# Patient Record
Sex: Female | Born: 1965
Health system: Southern US, Community
[De-identification: ages and names within clinical notes are randomized; demographics above are authoritative.]

## PROBLEM LIST (undated history)

## (undated) DIAGNOSIS — K219 Gastro-esophageal reflux disease without esophagitis: Secondary | ICD-10-CM

## (undated) DIAGNOSIS — R519 Headache, unspecified: Secondary | ICD-10-CM

## (undated) DIAGNOSIS — K5190 Ulcerative colitis, unspecified, without complications: Secondary | ICD-10-CM

## (undated) DIAGNOSIS — N870 Mild cervical dysplasia: Secondary | ICD-10-CM

## (undated) DIAGNOSIS — M1990 Unspecified osteoarthritis, unspecified site: Secondary | ICD-10-CM

## (undated) DIAGNOSIS — R21 Rash and other nonspecific skin eruption: Secondary | ICD-10-CM

## (undated) DIAGNOSIS — R5382 Chronic fatigue, unspecified: Secondary | ICD-10-CM

## (undated) DIAGNOSIS — M797 Fibromyalgia: Secondary | ICD-10-CM

## (undated) DIAGNOSIS — K529 Noninfective gastroenteritis and colitis, unspecified: Secondary | ICD-10-CM

## (undated) HISTORY — DX: Rash and other nonspecific skin eruption: R21

## (undated) HISTORY — DX: Noninfective gastroenteritis and colitis, unspecified: K52.9

## (undated) HISTORY — DX: Mild cervical dysplasia: N87.0

## (undated) HISTORY — DX: Fibromyalgia: M79.7

## (undated) HISTORY — PX: CHOLECYSTECTOMY: SHX55

## (undated) HISTORY — DX: Chronic fatigue, unspecified: R53.82

## (undated) HISTORY — PX: CERVICAL DISC SURGERY: SHX588

## (undated) HISTORY — PX: INTRAUTERINE DEVICE INSERTION: SHX323

---

## 1989-03-27 HISTORY — PX: GYNECOLOGIC CRYOSURGERY: SHX857

## 1998-08-16 ENCOUNTER — Observation Stay (HOSPITAL_COMMUNITY): Admission: AD | Admit: 1998-08-16 | Discharge: 1998-08-16 | Payer: Self-pay | Admitting: Gynecology

## 1998-08-27 ENCOUNTER — Other Ambulatory Visit: Admission: RE | Admit: 1998-08-27 | Discharge: 1998-08-27 | Payer: Self-pay | Admitting: Gynecology

## 1998-09-24 ENCOUNTER — Inpatient Hospital Stay (HOSPITAL_COMMUNITY): Admission: AD | Admit: 1998-09-24 | Discharge: 1998-09-24 | Payer: Self-pay | Admitting: Gynecology

## 1999-03-03 ENCOUNTER — Inpatient Hospital Stay (HOSPITAL_COMMUNITY): Admission: AD | Admit: 1999-03-03 | Discharge: 1999-03-05 | Payer: Self-pay | Admitting: Gynecology

## 1999-03-04 ENCOUNTER — Encounter: Payer: Self-pay | Admitting: Obstetrics and Gynecology

## 1999-04-10 ENCOUNTER — Inpatient Hospital Stay (HOSPITAL_COMMUNITY): Admission: AD | Admit: 1999-04-10 | Discharge: 1999-04-13 | Payer: Self-pay | Admitting: Gynecology

## 1999-05-23 ENCOUNTER — Other Ambulatory Visit: Admission: RE | Admit: 1999-05-23 | Discharge: 1999-05-23 | Payer: Self-pay | Admitting: Gynecology

## 1999-11-03 ENCOUNTER — Inpatient Hospital Stay (HOSPITAL_COMMUNITY): Admission: EM | Admit: 1999-11-03 | Discharge: 1999-11-06 | Payer: Self-pay | Admitting: *Deleted

## 1999-11-03 ENCOUNTER — Encounter (INDEPENDENT_AMBULATORY_CARE_PROVIDER_SITE_OTHER): Payer: Self-pay | Admitting: Specialist

## 1999-11-04 ENCOUNTER — Encounter: Payer: Self-pay | Admitting: General Surgery

## 1999-11-05 ENCOUNTER — Encounter: Payer: Self-pay | Admitting: General Surgery

## 2000-06-13 ENCOUNTER — Other Ambulatory Visit: Admission: RE | Admit: 2000-06-13 | Discharge: 2000-06-13 | Payer: Self-pay | Admitting: Gynecology

## 2001-11-20 ENCOUNTER — Ambulatory Visit (HOSPITAL_COMMUNITY): Admission: RE | Admit: 2001-11-20 | Discharge: 2001-11-20 | Payer: Self-pay | Admitting: Family Medicine

## 2001-11-20 ENCOUNTER — Encounter: Payer: Self-pay | Admitting: Family Medicine

## 2002-07-02 ENCOUNTER — Other Ambulatory Visit: Admission: RE | Admit: 2002-07-02 | Discharge: 2002-07-02 | Payer: Self-pay | Admitting: Gynecology

## 2002-10-16 ENCOUNTER — Encounter: Payer: Self-pay | Admitting: Gynecology

## 2002-10-16 ENCOUNTER — Encounter: Admission: RE | Admit: 2002-10-16 | Discharge: 2002-10-16 | Payer: Self-pay | Admitting: Gynecology

## 2002-11-13 ENCOUNTER — Encounter (INDEPENDENT_AMBULATORY_CARE_PROVIDER_SITE_OTHER): Payer: Self-pay | Admitting: *Deleted

## 2002-11-13 ENCOUNTER — Ambulatory Visit (HOSPITAL_COMMUNITY): Admission: RE | Admit: 2002-11-13 | Discharge: 2002-11-13 | Payer: Self-pay | Admitting: Gastroenterology

## 2003-03-10 ENCOUNTER — Ambulatory Visit (HOSPITAL_COMMUNITY): Admission: RE | Admit: 2003-03-10 | Discharge: 2003-03-10 | Payer: Self-pay | Admitting: Otolaryngology

## 2003-03-28 DIAGNOSIS — N870 Mild cervical dysplasia: Secondary | ICD-10-CM

## 2003-03-28 HISTORY — DX: Mild cervical dysplasia: N87.0

## 2003-08-03 ENCOUNTER — Other Ambulatory Visit: Admission: RE | Admit: 2003-08-03 | Discharge: 2003-08-03 | Payer: Self-pay | Admitting: Gynecology

## 2004-01-22 ENCOUNTER — Encounter: Admission: RE | Admit: 2004-01-22 | Discharge: 2004-01-22 | Payer: Self-pay | Admitting: Gynecology

## 2004-04-04 ENCOUNTER — Other Ambulatory Visit: Admission: RE | Admit: 2004-04-04 | Discharge: 2004-04-04 | Payer: Self-pay | Admitting: Gynecology

## 2004-08-04 ENCOUNTER — Other Ambulatory Visit: Admission: RE | Admit: 2004-08-04 | Discharge: 2004-08-04 | Payer: Self-pay | Admitting: Gynecology

## 2005-03-06 ENCOUNTER — Other Ambulatory Visit: Admission: RE | Admit: 2005-03-06 | Discharge: 2005-03-06 | Payer: Self-pay | Admitting: Gynecology

## 2005-03-21 ENCOUNTER — Encounter: Admission: RE | Admit: 2005-03-21 | Discharge: 2005-03-21 | Payer: Self-pay | Admitting: Gynecology

## 2005-08-11 ENCOUNTER — Other Ambulatory Visit: Admission: RE | Admit: 2005-08-11 | Discharge: 2005-08-11 | Payer: Self-pay | Admitting: Gynecology

## 2005-09-22 ENCOUNTER — Inpatient Hospital Stay (HOSPITAL_COMMUNITY): Admission: AD | Admit: 2005-09-22 | Discharge: 2005-10-11 | Payer: Self-pay | Admitting: Gastroenterology

## 2005-09-26 ENCOUNTER — Encounter (INDEPENDENT_AMBULATORY_CARE_PROVIDER_SITE_OTHER): Payer: Self-pay | Admitting: *Deleted

## 2005-10-06 ENCOUNTER — Ambulatory Visit: Payer: Self-pay | Admitting: Gastroenterology

## 2005-10-18 ENCOUNTER — Encounter (HOSPITAL_COMMUNITY): Admission: RE | Admit: 2005-10-18 | Discharge: 2005-12-13 | Payer: Self-pay | Admitting: Gastroenterology

## 2006-01-12 ENCOUNTER — Encounter (HOSPITAL_COMMUNITY): Admission: RE | Admit: 2006-01-12 | Discharge: 2006-04-12 | Payer: Self-pay | Admitting: Gastroenterology

## 2006-05-23 ENCOUNTER — Encounter: Admission: RE | Admit: 2006-05-23 | Discharge: 2006-05-23 | Payer: Self-pay | Admitting: Gynecology

## 2006-06-08 ENCOUNTER — Encounter (HOSPITAL_COMMUNITY): Admission: RE | Admit: 2006-06-08 | Discharge: 2006-09-06 | Payer: Self-pay | Admitting: Gastroenterology

## 2006-09-07 ENCOUNTER — Other Ambulatory Visit: Admission: RE | Admit: 2006-09-07 | Discharge: 2006-09-07 | Payer: Self-pay | Admitting: Gynecology

## 2006-11-09 ENCOUNTER — Encounter (HOSPITAL_COMMUNITY): Admission: RE | Admit: 2006-11-09 | Discharge: 2007-01-30 | Payer: Self-pay | Admitting: Gastroenterology

## 2007-02-13 ENCOUNTER — Other Ambulatory Visit: Admission: RE | Admit: 2007-02-13 | Discharge: 2007-02-13 | Payer: Self-pay | Admitting: Gynecology

## 2007-02-22 ENCOUNTER — Encounter (HOSPITAL_COMMUNITY): Admission: RE | Admit: 2007-02-22 | Discharge: 2007-04-05 | Payer: Self-pay | Admitting: Gastroenterology

## 2007-05-10 ENCOUNTER — Encounter (HOSPITAL_COMMUNITY): Admission: RE | Admit: 2007-05-10 | Discharge: 2007-08-08 | Payer: Self-pay | Admitting: Gastroenterology

## 2007-07-04 ENCOUNTER — Encounter: Admission: RE | Admit: 2007-07-04 | Discharge: 2007-07-04 | Payer: Self-pay | Admitting: Gynecology

## 2007-09-16 ENCOUNTER — Other Ambulatory Visit: Admission: RE | Admit: 2007-09-16 | Discharge: 2007-09-16 | Payer: Self-pay | Admitting: Gynecology

## 2007-10-18 ENCOUNTER — Encounter (HOSPITAL_COMMUNITY): Admission: RE | Admit: 2007-10-18 | Discharge: 2007-12-18 | Payer: Self-pay | Admitting: Gastroenterology

## 2007-12-20 ENCOUNTER — Encounter (HOSPITAL_COMMUNITY): Admission: RE | Admit: 2007-12-20 | Discharge: 2008-03-19 | Payer: Self-pay | Admitting: Gastroenterology

## 2008-04-24 ENCOUNTER — Encounter (HOSPITAL_COMMUNITY): Admission: RE | Admit: 2008-04-24 | Discharge: 2008-07-23 | Payer: Self-pay | Admitting: Gastroenterology

## 2008-07-20 ENCOUNTER — Encounter: Admission: RE | Admit: 2008-07-20 | Discharge: 2008-07-20 | Payer: Self-pay | Admitting: Gynecology

## 2008-08-14 ENCOUNTER — Encounter (HOSPITAL_COMMUNITY): Admission: RE | Admit: 2008-08-14 | Discharge: 2008-11-12 | Payer: Self-pay | Admitting: Gastroenterology

## 2008-12-18 ENCOUNTER — Encounter (HOSPITAL_COMMUNITY): Admission: RE | Admit: 2008-12-18 | Discharge: 2009-03-18 | Payer: Self-pay | Admitting: Gastroenterology

## 2009-01-26 ENCOUNTER — Encounter: Admission: RE | Admit: 2009-01-26 | Discharge: 2009-03-24 | Payer: Self-pay | Admitting: Gastroenterology

## 2009-02-04 ENCOUNTER — Encounter: Payer: Self-pay | Admitting: Gynecology

## 2009-02-04 ENCOUNTER — Other Ambulatory Visit: Admission: RE | Admit: 2009-02-04 | Discharge: 2009-02-04 | Payer: Self-pay | Admitting: Gynecology

## 2009-02-04 ENCOUNTER — Ambulatory Visit: Payer: Self-pay | Admitting: Gynecology

## 2009-04-09 ENCOUNTER — Encounter (HOSPITAL_COMMUNITY): Admission: RE | Admit: 2009-04-09 | Discharge: 2009-07-08 | Payer: Self-pay | Admitting: Gastroenterology

## 2009-07-19 ENCOUNTER — Ambulatory Visit: Payer: Self-pay | Admitting: Gynecology

## 2009-08-20 ENCOUNTER — Encounter (HOSPITAL_COMMUNITY): Admission: RE | Admit: 2009-08-20 | Discharge: 2009-11-18 | Payer: Self-pay | Admitting: Gastroenterology

## 2009-12-17 ENCOUNTER — Encounter (HOSPITAL_COMMUNITY)
Admission: RE | Admit: 2009-12-17 | Discharge: 2010-03-17 | Payer: Self-pay | Source: Home / Self Care | Attending: Gastroenterology | Admitting: Gastroenterology

## 2010-01-14 ENCOUNTER — Encounter: Admission: RE | Admit: 2010-01-14 | Discharge: 2010-01-14 | Payer: Self-pay | Admitting: Gynecology

## 2010-01-21 ENCOUNTER — Encounter: Admission: RE | Admit: 2010-01-21 | Discharge: 2010-01-21 | Payer: Self-pay | Admitting: Gynecology

## 2010-02-07 ENCOUNTER — Ambulatory Visit: Payer: Self-pay | Admitting: Gynecology

## 2010-02-07 ENCOUNTER — Other Ambulatory Visit
Admission: RE | Admit: 2010-02-07 | Discharge: 2010-02-07 | Payer: Self-pay | Source: Home / Self Care | Admitting: Gynecology

## 2010-02-23 ENCOUNTER — Ambulatory Visit: Payer: Self-pay | Admitting: Gynecology

## 2010-04-08 ENCOUNTER — Encounter (HOSPITAL_COMMUNITY)
Admission: RE | Admit: 2010-04-08 | Discharge: 2010-04-26 | Payer: Self-pay | Source: Home / Self Care | Attending: Gastroenterology | Admitting: Gastroenterology

## 2010-06-03 ENCOUNTER — Encounter (HOSPITAL_COMMUNITY): Payer: BC Managed Care – PPO | Attending: Gastroenterology

## 2010-06-03 DIAGNOSIS — K5100 Ulcerative (chronic) pancolitis without complications: Secondary | ICD-10-CM | POA: Insufficient documentation

## 2010-07-29 ENCOUNTER — Encounter (HOSPITAL_COMMUNITY): Payer: BC Managed Care – PPO | Attending: Gastroenterology

## 2010-07-29 DIAGNOSIS — K5100 Ulcerative (chronic) pancolitis without complications: Secondary | ICD-10-CM | POA: Insufficient documentation

## 2010-08-12 NOTE — Op Note (Signed)
   NAME:  Sarah Ramos, Sarah Ramos                           ACCOUNT NO.:  [id number]   MEDICAL RECORD NO.:  [id number]                   PATIENT TYPE:  AMB   LOCATION:  ENDO                                 FACILITY:  [facility]   PHYSICIAN:  Graylin Shiver, M.D.                DATE OF BIRTH:  09-Jun-1965   DATE OF PROCEDURE:  11/13/2002  DATE OF DISCHARGE:                                 OPERATIVE REPORT   PROCEDURE:  Colonoscopy and biopsy.   INDICATIONS FOR PROCEDURE:  Rectal bleeding.   CONSENT:  Informed consent was obtained after explanation of the risks of  bleeding, infection, and perforation.   PREMEDICATION:  Fentanyl 70 mcg IV, Versed 7 mg IV.   DESCRIPTION OF PROCEDURE:  With the patient in the left lateral decubitus  position, the rectal exam was performed.  No masses were felt.  The Olympus  colonoscope was inserted into the rectum and advanced around the colon to  the cecum.  Cecal landmarks were identified.  The cecum and ascending colon  were normal.  The transverse colon was normal.  The descending colon and  sigmoid were normal.  In the rectum there was a small 3 mm sessile polyp  removed with cold forceps.  She tolerated the procedure well without  complications.   IMPRESSION:  Small rectal polyp.   PLAN:  Pathology will be checked.                                               Graylin Shiver, M.D.    SFG/MEDQ  D:  11/13/2002  T:  11/13/2002  Job:  [id number]   cc:   Marcial Pacas P. Audie Box, M.D.  [address]  [place]  [place] [address]  Fax: [phone]

## 2010-08-12 NOTE — Discharge Summary (Signed)
[facility]  Patient:    Sarah Ramos, Sarah Ramos                        MRN: [id number] Adm. Date:  [id number] Disc. Date: [id number] Attending:  Arlis Porta CC:         Verlin Grills, M.D.             Meredith Staggers, M.D.                           Discharge Summary  PRINCIPAL DISCHARGE DIAGNOSIS:  Choledocholithiasis.  SECONDARY DIAGNOSES:  Calculus cholecystitis.  PROCEDURES: 1. ERCP by Dr. Danise Edge, November 04, 1999. 2. Laparoscopic cholecystectomy with intraoperative cholangiogram and    transcystic common bile duct exploration, November 04, 1999. 3. Repeat ERCP done by Dr. Danise Edge, November 05, 1999.  REASON FOR ADMISSION:  [name] is a 45 year old female who is seven months postpartum with the acute onset of right upper quadrant pain the day prior to admission, associated with nausea and vomiting.  She subsequently underwent an ultrasound of her abdomen.  This demonstrated cholelithiasis.  She was noted to be jaundiced by her primary caregiver and was subsequently seen by me in the emergency department.  Her abdomen was mildly tender.  She had a total bilirubin of 5.9.  She subsequently was admitted.  HOSPITAL COURSE:  Gastroenterology consultation was obtained by Dr. Danise Edge, and she went to the endoscopy suite for ERCP on November 04, 1999.  An endoscopic sphincterotomy was performed, and a common bile duct stone was removed with the biliary basket.  She tolerated this well and, later that evening, was taken to the operating room where she underwent a laparoscopic cholecystectomy with cholangiogram.  This demonstrated two obstructing common bile duct stones.  One was able to be retrieved, but the other was not.  The following day she underwent repeat ERCP, and this evacuated the remaining stone.  Following that, she had a stable postoperative course and, on November 06, 1999, she was able to be  discharged.  DISPOSITION:  Discharged to home on November 06, 1999.  DISCHARGE INSTRUCTIONS:  She was given Mepergan Fortis for pain.  She was given an instruction sheet, and she will come back to see me in two weeks for follow-up.  CONDITION ON DISCHARGE:  Satisfactory. DD:  11/18/99 TD:  11/21/99 Job: [id number]

## 2010-08-12 NOTE — Discharge Summary (Signed)
[facility]  Patient:    Sarah Ramos                         MRN: [id number] Adm. Date:  [id number] Disc. Date: [id number] Attending:  Merrily Pew Dictator:   Gwendolyn Fill. [place], P.A. CC:         840                           Discharge Summary  DISCHARGE DIAGNOSIS:          Intrauterine pregnancy, 39+ weeks, delivered, status post forceps assisted vaginal delivery.  HISTORY:                      This is a 45 year old female gravida 1, para 0 with an EDC of April 07, 1999.  Prenatal course was complicated by hyperemesis.  HOSPITAL COURSE:              On April 10, 1999, the patient presented at 39+  weeks with spontaneous rupture of membranes.  Subsequently, on April 11, 1999, at 0354, the patient underwent a Simpson forceps assisted vaginal delivery of a female, Apgars of 7 and 9, weight of 6 pounds 8 ounces.  There was a tight nuchal cord 1. There was no episiotomy.  Sacral and midline lacerations repaired, and a torn left labia minora which was also repaired.  Postpartum, the patient remained afebrile, voided, and in stable condition.  She was discharged to home on April 13, 1999, and given [employer] postpartum booklet.  LABORATORY:                   The patient is A positive, Rubella immune.  On April 11, 1999, hemoglobin was 10.3, hematocrit 29.7.  DISPOSITION:                  The patient was discharged home.  She will return to the office in six weeks.  If any problems prior to that time, she will be seen n the office. DD:  04/29/99 TD:  04/30/99 Job: [id number]

## 2010-08-12 NOTE — Procedures (Signed)
[facility]  Patient:    Sarah Ramos, Sarah Ramos                          MRN: [id number] Proc. Date: 11/05/99 Attending:  Verlin Grills, M.D. CC:         Adolph Pollack, M.D.   Procedure Report  PROCEDURE:  Endoscopic retrograde cholangiopancreatography with common bile duct extraction.  REFERRING PHYSICIAN:  Avel Peace, M.D.  INDICATIONS FOR PROCEDURE:  Ms. Sarah Ramos is a 45 year old  female admitted to the hospital November 03, 1999 with biliary colic pain, gallstones, and common bile duct stones.  On November 04, 1999, she underwent an endoscopic retrograde cholangiopancreatography which revealed an isolated common bile duct stone. An endoscopic sphincterotomy was performed. I thought I had cleared the bile duct of stones using the biliary basket.  On November 04, 1999, Ms. Sarah Ramos underwent a laparoscopic cholecystectomy with intraoperative cholangiogram. The operative cholangiogram reveals filling defects in the common bile duct compatible with stones.  Ms. Sarah Ramos has signed the operative permit for a repeat endoscopic retrograde cholangiography to extract common bile duct stones.  ENDOSCOPIST:  Reece Agar, M.D.  PREMEDICATION:  Versed 10 mg, fentanyl 100 mcg.  ENDOSCOPE:  Olympus duodenoscope.  PROCEDURE:  After obtaining informed consent, the patient was placed in the left lateral decubitus position on the fluoroscopy table. I administered intravenous Demerol and intravenous Versed to achieve conscious sedation for the procedure. The patients blood pressure, oxygen saturation and cardiac rhythm were monitored throughout the procedure and documented in the medical record.  The Olympus duodenoscope was passed through the posterior hypopharynx and down the esophagus into the proximal stomach without difficulty. The Olympus duodenoscope was passed through the posterior hypopharynx down the esophagus into the proximal stomach without  difficulty. A normal appearing pylorus was intubated and the endoscope advanced to the second portion of the duodenum without difficulty.  MAJOR PAPILLA: The endoscopic sphincterotomy site is quite patent and is draining clear bile.  PANCREATOGRAM:  The pancreatic cut was not cannulated and a pancreatogram was not performed.  CHOLANGIOGRAM:  Using the 8.5 mm balloon catheter, I performed multiple sweeps of the extrahepatic biliary tract resulting in the delivery of 1 common bile duct stone into the duodenum through the patent sphincterotomy. The inflated 8.5 mm balloon easily traversed through the sphincterotomy site.  A final occlusion cholangiogram using the 8.5 mm balloon catheter with the radiologist present revealed no biliary filling defects. DD:  11/05/99 TD:  11/07/99 Job: [id number]

## 2010-08-12 NOTE — H&P (Signed)
[facility]  Patient:    Sarah Ramos, Sarah Ramos                        MRN: [id number] Adm. Date:  [id number] Attending:  Arlis Porta CC:         Meredith Staggers, M.D., [facility]  Charolett Bumpers III, M.D.   History and Physical  REASON FOR ADMISSION:  Abdominal pain in the right upper quadrant and gallstones.  HISTORY OF PRESENT ILLNESS:  This is a 45 year old female who is seven months postpartum.  She had the acute onset of right upper quadrant pain yesterday associated with nausea and vomiting.  She presented to [facility], was seen and had an ultrasound on her today which, by report, demonstrated multiple gallstones.  The common bile duct was reported to be normal in diameter with no biliary duct dilatation.  No pericholecystic fluid was seen. When she went back to [facility] for followup, she was noted to have scleral icterus.  She subsequently was sent to the emergency department to be evaluated by me.  She did state that during her pregnancy, she had severe nausea and vomiting.  PAST MEDICAL HISTORY:  No chronic illnesses.  PREVIOUS OPERATIONS:  None.  ALLERGIES:  CODEINE and TETRACYCLINE.  MEDICATIONS:  Darvocet-N and birth control pills.  SOCIAL HISTORY:  She is married.  One child.  Denies tobacco or alcohol use.  FAMILY HISTORY:  Father had Guillain-Barre and hypertension.  REVIEW OF SYSTEMS:  CARDIOVASCULAR:  No known heart disease or hypertension. PULMONARY:  No asthma, COPD, TB or pneumonia.  GI:  No peptic ulcer disease or hepatitis.  GU:  No kidney stones or urinary tract infection.  ENDOCRINE:  No diabetes or thyroid disease.  HEMATOLOGIC:  No bleeding disorders or transfusions.  PHYSICAL EXAMINATION:  GENERAL:  A comfortable-appearing female.  VITAL SIGNS:  Temperature is 97.7.  Blood pressure 137/70.  Pulse 70. Respirations are 20.  Weight is 180 pounds.  Height 5 feet 8  inches.  SKIN:  Warm with jaundice.  EYES:  Extraocular motions intact and sclerae are icteric.  NECK:  Supple without masses or thyromegaly.  CARDIOVASCULAR:  Heart demonstrates a regular rate and rhythm without murmur, gallop or rub.  No lower extremity edema.  RESPIRATORY:  Breath sounds are equal and clear.  Respirations are nonlabored.  ABDOMEN:  Soft with mild right upper quadrant tenderness to deep palpation. No palpable masses.  No guarding.  Normal bowel sounds are heard.  MUSCULOSKELETAL:  Full range of motion of extremities.  LABORATORY DATA:  Laboratory data is remarkable for normal electrolytes.  Her total bilirubin is 5.8 with alkaline phosphatase of 218, ALT of 298 and AST of 181.  The WBC is normal at 6600 with a normal hemoglobin at 13.2.  Amylase and lipase normal.  IMPRESSION:  Biliary colic secondary to cholelithiasis and choledocholithiasis.  Does not appear to be toxic at this time.  PLAN:  Admission to the hospital.  Will begin empiric IV antibiotics.  I have consulted Dr. Charolett Bumpers III regarding ERCP and likely will need to follow this with a cholecystectomy.  The procedure, a laparoscopic cholecystectomy, including, but not limited to, bleeding, infection, common bile duct injury, bile leak from liver bed and the risks of general anesthesia were explained to her; it was also explained to her that if Dr. Laural Benes was unable to evacuate the common duct stone, she may need  an extensive open procedure.  Both she and her husband seem to understand and agree with the plan. DD:  11/03/99 TD:  11/04/99 Job: [id number]

## 2010-08-12 NOTE — H&P (Signed)
[name], Sarah Ramos                 ACCOUNT NO.:  [id number]   MEDICAL RECORD NO.:  [id number]          PATIENT TYPE:  INP   LOCATION:  5742                         FACILITY:  [facility]   PHYSICIAN:  Jordan Hawks. Elnoria Howard, MD    DATE OF BIRTH:  08-21-1965   DATE OF ADMISSION:  09/22/2005  DATE OF DISCHARGE:                                HISTORY & PHYSICAL   PRIMARY CARE PHYSICIAN:  Robert A. Nicholos Johns, M.D.   REASON FOR ADMISSION:  Ulcerative colitis flare.   HISTORY OF PRESENT ILLNESS:  This is a 45 year old female with a past  medical history of ulcerative colitis seen by Dr. Loreta Ave in the past who was  admitted for __________ of her ulcerative colitis flare.  Patient initially  saw Dr. Loreta Ave on September 12, 2005 with a complaint of having 9-10 bloody bowel  movements per day as well as nausea and abdominal pain.  She was started on  Colazal 750 mg 2 tablets p.o. t.i.d. as well as an increase in her Asacol to  4 tablets p.o. t.i.d. per Dr. Loreta Ave for the treatment of her flare.  Unfortunately, during the interval period, she has not progressed, and she  states that she has lost approximately 10 pounds, and the pain persists.  Her bloody bowel movements appear to have increased to approximately 15  bowel movements per day and she stays feeling weak.  She denies having any  fever, joint aches, or skin rashes at this time.  The patient was previously  treated with steroids in the past.  Unfortunately, she developed a reaction  to the medication with complaints of chest pain that feels like a heart  attack.  Therefore, she was not started on the medication and only  maintained on a 5-ASA product.  She called the office yesterday and reported  her complaints.  It was felt that she should be admitted to the hospital  yesterday; however, she wanted to wait until the next day in order to  arrange her family affairs before being admitted to the hospital.  On the  day of admission, the patient's clinical status  had not changed, and she is  subsequently awaiting further treatment in the hospital.   PAST MEDICAL HISTORY:  Significant for ulcerative colitis, depression, and  migraine headaches.   PAST SURGICAL HISTORY:  Status post laparoscopic cholecystectomy in 2001 and  normal vaginal delivery in 2001.   MEDICATIONS AT HOME:  Xanax, Asacol, hyoscyamine, and fluoxetine.   REVIEW OF SYSTEMS:  As per the HPI and also negative for blurry vision,  dizziness, chest pain, shortness of breath, rashes, joint aches,  arthralgias.  Positive for urinary hesitance.  No gross neurologic deficits.   FAMILY HISTORY:  Noncontributory.   SOCIAL HISTORY:  Negative for alcohol, tobacco, or illicit drug use.   PHYSICAL EXAMINATION:  VITAL SIGNS:  Blood pressure 123/90, heart rate 104,  respirations 18, temperature 98.4.  Pulse ox is 98% on room air.  GENERAL:  The patient is in no acute distress; however, she does appear to  be comfortable.  HEENT:  Normocephalic and  atraumatic.  Extraocular muscles are intact.  Pupils are equal, round and reactive to light.  NECK:  Supple.  No lymphadenopathy.  LUNGS:  Clear to auscultation bilaterally.  CARDIOVASCULAR:  Regular rate and rhythm without murmurs, rubs or gallops.  Tachycardic.  ABDOMEN:  Flat, soft, obese.  Tender to mild palpation.  No consistent  evidence of rebound and no rigidity or guarding.  Positive bowel sounds.  EXTREMITIES:  No clubbing, cyanosis or edema.   LABORATORY VALUES:  White blood cell count 9.3, hemoglobin 13.6, platelets  377.  Sodium 139, potassium 3.6, chloride 102, CO2 25, BUN 6, creatinine 1,  glucose 84, AST 19, ALT 16, alk phos 66, total bilirubin 0.6, albumin 3.6.  ESR is 31.   IMPRESSION:  Ulcerative colitis flare:  It is apparent from the patient's  history that she has not improved with the Colazal that was prescribed.  She  is on a maximal dose of the Asacol, per her history, and she should be  maintained on this  medication at this time.  I am concerned about her  abdominal pain, and she does not appear to be toxic at this time, and the  pain has not progressed; however, further imaging to rule out toxic  megacolon is necessary at this time.   PLAN:  1.  Maintain Asacol 400 mg 4 tablets p.o. t.i.d.  2.  Start budesonide 9 mg 1 p.o. daily.  Hopefully, she will be able to      tolerate this medication without any side effects of chest pain, as this      has less systemic side effects.  3.  Start Imuran 50 mg 1 p.o. daily.  This will be used in hopes of long-      term immunosuppression.  4.  CT scan of the abdomen and pelvis.  5.  D5 normal saline at 200 cc/hr.  6.  Pain control with PCA.  7.  Check a PPD status.  If the patient fails budesonide, Remicade will need      to be instituted.      Jordan Hawks Elnoria Howard, MD  Electronically Signed     PDH/MEDQ  D:  09/22/2005  T:  09/22/2005  Job:  [id number]   cc:   Molly Maduro A. Nicholos Johns, M.D.  Fax: [phone]

## 2010-08-12 NOTE — Procedures (Signed)
[facility]  Patient:    Sarah Ramos, Sarah Ramos                          MRN: [id number] Proc. Date: 11/04/99 Attending:  Verlin Grills, M.D. CC:         Adolph Pollack, M.D.   Procedure Report  PROCEDURE:  Endoscopic retrograde cholangiopancreatography with endoscopic sphincterotomy and common bile duct stone removal.  REFERRING PHYSICIAN:  Avel Peace, M.D.  INDICATIONS FOR PROCEDURE:  Sarah Ramos is a 45 year old female who is 7 months postpartum. She presented to the [facility] Emergency Room November 03, 1999 with biliary colic type pain, gallstones by ultrasound, and abnormal liver enzymes (total bilirubin 5.9, alkaline phosphatase 218, SGOT 181, and SGPT 298). Her amylase and lipase were normal on admission.  I discussed with Sarah Ramos the complications associated with endoscopic retrograde cholangiopancreatography with endoscopic sphincterotomy and stone removal including pancreatitis, bleeding, intestinal perforation and cholangitis. Sarah Ramos understands the procedure with its risks and has signed the operative permit. She is currently on intravenous Unasyn.  ENDOSCOPIST:  Reece Agar, M.D.  PREMEDICATION:  Versed 17.5 mg, fentanyl 100 mcg.  ENDOSCOPE:  Olympus diagnostic duodenoscope.  DESCRIPTION OF PROCEDURE:  After obtaining informed consent, the patient was placed supine on the fluoroscopy table. I administered intravenous Demerol and intravenous Versed to achieve conscious sedation for the procedure. The patients blood pressure, oxygen saturation and cardiac rhythm were monitored throughout the procedure and documented in the medical record.  The Olympus diagnostic duodenoscope was passed through the posterior hypopharynx down the esophagus into the proximal stomach without difficulty. A normal appearing pylorus was intubated and the endoscope easily advanced to the second portion of the duodenum.  MAJOR PAPILLA:   Endoscopically, the major papilla appears normal. There are no duodenal diverticula or signs of duodenal tumors.  PANCREATOGRAM:  I did partially inject the pancreatic duct on 2 occasions with contrast. The mid-distal pancreatic duct appeared normal.  CHOLANGIOGRAM:  Using the tapered sphincterotome, I was able to freely cannulate the common bile duct. The intrahepatic, extrahepatic biliary ductal system as well as the gallbladder was filled with contrast. There is a round filling defect in the common bile duct compatible with a stone. There are multiple stones in the gallbladder. The cystic duct appears patent. There were no other abnormalities of the intrahepatic or extrahepatic biliary system.  An endoscopic sphincterotomy was performed without apparent complications. I was unable to retrieve the stone using the 8.5 mm balloon catheter. Using the biliary retrieval basket, I was able to capture the stone and remove it successfully.  Upon completion of the basket stone removal, a repeat occlusion cholangiogram using the 8.5 mm balloon catheter revealed no other filling defects.  ASSESSMENT:  Choledocholithiasis and cholelithiasis. Endoscopic retrograde cholangiopancreatography with endoscopic sphincterotomy performed. Biliary stone removal with the biliary basket.  PLAN:  Because I did inject the pancreatic duct on 2 occasions and there was some trauma in removing the biliary stone, I will need to observe Sarah Ramos carefully for signs of pancreatitis. I will keep her on intravenous Unasyn. If she has no abdominal pain, she can proceed with a laparoscopic cholecystectomy. DD:  11/04/99 TD:  11/05/99 Job: [id number]

## 2010-08-12 NOTE — Discharge Summary (Signed)
[name], Ramos                 ACCOUNT NO.:  [id number]   MEDICAL RECORD NO.:  [id number]          PATIENT TYPE:  INP   LOCATION:  5742                         FACILITY:  [facility]   PHYSICIAN:  Jordan Hawks. Elnoria Howard, MD    DATE OF BIRTH:  03/27/1966   DATE OF ADMISSION:  09/22/2005  DATE OF DISCHARGE:  10/11/2005                                 DISCHARGE SUMMARY   ADMISSION DIAGNOSIS:  Severe ulcerative colitis flare.   DISCHARGE DIAGNOSIS:  Severe ulcerative colitis flare.   HISTORY OF PRESENT ILLNESS:  Please see the original history and physical  for complete details.   HOSPITAL COURSE:  The patient was admitted to the hospital for multiple  bloody bowel movements as well as dehydration and pain.  Upon initial  presentation the patient was started on budesonide as she states she was  having difficulty with prednisone in the past, which resulted in severe  chest pain.  The budesonide was provided over the weekend and unfortunately,  she had no significant response.  She continued to have pain and bloody  bowel movement.  At that time it was felt to retry the prednisone and she  was given a 5 mg test dose which she tolerated without any difficulty.  Subsequently she was started on Solu-Medrol 40 mg IV q.12 h. which she  tolerated without any difficulty over the intervening period she did not  demonstrate any significant improvement on the steroids.  She continued to  have multiple bowel movements that ranged from 7-15 times per day,  associated with blood, and they were liquid in nature.  She was on Dilaudid  PCA and required the medication on a frequent basis. Because of the  nonprogression of her disease, a colonoscopy was performed which revealed a  pancolitis with the exception of the most proximal portion of descending  colon and cecum being spared.  The majority of the colon was noted to have  severe ulcerations and erosions which were friable and granular.  Surprisingly, the  distal portion of the colon was not as severe in regards  to the inflammation.  Biopsies were taken and confirmed the gross appearance  of the ulcerative colitis.   After 1 week of being on Solu-Medrol without any significant improvement,  the decision was made to start the patient on Remicade.  She received an  infusion 1 week prior to discharge at 5 mg/kg which she tolerated quite  well; however, the patient did complain about having a headache that lasted  for approximately 5 days.  After the infusion, she was maintained on an  n.p.o. status as well as continuing to receive TPN over the course of the  hospitalization.  With the infusion of the TPN she was able to maintain her  nutritional status initially.  Initially she was made n.p.o. and not  provided any significant type of nutrition for approximately 1 week in the  hospital; however, she was given D5 normal saline.  Over the course of the  hospitalization after the infusion, the patient did slowly improve to the  point that she  was able to tolerate liquids without much pain or  exacerbation of her diarrhea; and 2 days before discharge, she was able to  start on a solid food regimen which she tolerated without any difficulty.  On the day of discharge she felt much improved and her appetite had  returned; and the pain was markedly less.  She did continue to have some  multiple bowel movements; however on the evening before discharge.  She had  reported having no bowel movement.  Improvement in regards to the diarrhea  was that the stool was becoming more formed; and there is no evidence of any  blood.  Because of her improvement, it was felt that she would be able to be  discharged home safely.   PLAN AT THIS TIME:  1.  The patient is to followup with the repeat infusion of Remicade in 1      week.  2.  She will maintain a prednisone taper starting at 20 mg and rapidly      tapered off on a 3-week period to 5 mg and then finally  to be      discontinued off of the medication.  3.  The patient will maintain  __________ 50 mg daily; and she will be      advanced to 100 mg provided that there are no abnormalities in her      transaminases.   MEDICATIONS:  1.  It was initially thought that she was started on Imuran at the time of      admission; however, upon review of orders it was clear that she had not      been started; and, therefore, at the time of admission she had only been      on the medication, 50 mg, for 1 week; elevations in her medications will      be performed on an outpatient basis.  2.  She will continue on Dilaudid 2 mg q.4-6 h. p.r.n.  3.  She is instructed to resume Asacol, at home, and hopefully this will be      tapered off in the intervening weeks with the Remicade treatment.  4.  Ambien and Phenergan were provided for her for p.r.n. use   FOLLOWUP:  1.  Followup, again, Remicade infusion in 1 week.  2.  She is to followup with Dr. Elnoria Howard in the office and 2 weeks.  She can      call for any questions or concerns.      Jordan Hawks Elnoria Howard, MD  Electronically Signed     PDH/MEDQ  D:  10/11/2005  T:  10/11/2005  Job:  [id number]

## 2010-08-12 NOTE — Op Note (Signed)
[facility]  Patient:    ANDA, SOBOTTA                        MRN: [id number] Proc. Date: 11/04/99 Adm. Date:  [id number] Disc. Date: [id number] Attending:  Arlis Porta CC:         Verlin Grills, M.D.  Meredith Staggers, M.D.   Operative Report  PREOPERATIVE DIAGNOSIS:  Chololithiasis.  POSTOPERATIVE DIAGNOSIS:  Chololithiasis with choledocholithiasis.  OPERATION:  Laparoscopic cholecystectomy, intraoperative cholangiogram, transcystic common bile duct exploration.  SURGEON:  Adolph Pollack, M.D.  ASSISTANT:  Donnie Coffin. Samuella Cota, M.D.  ANESTHESIA:  General.  INDICATIONS:  This 45 year old female came in with right upper quadrant pain, gallstones and juandice.  She was taken for ERCP earlier this morning and underwent spincterotomy and extraction of some common bile duct stones.  There is some question of whether maybe one or two were left behind.  She did well from the ERCP.  However, this afternoon she began having some more of her back type pain.  She is scheduled to have a cholecystectomy.  DESCRIPTION OF PROCEDURE:  The patient was placed supine on the operating table, and general anesthetic was administered.  Her abdomen was sterilely prepped and draped.  Multiple anesthetic was infiltrated in the subumbilical region.  A incision was made in the subumbilical region and carried down to the fascia.  A 1 cm incision was made in the fascia and a pursestring suture of 0 Vicryl was placed around the fascial edges.  The peritoneal cavity was entered bluntly and under direct vision.  A Hasson trocar was introduced into the peritoneal cavity and pneumoperitoneum was created by insufflation of CO2 gas.  Next, the laparoscope was introduced.  I examined the abdominal cavity.  The gallbladder appeared to be slightly distended but soft.  No acute inflammatory changes were noted.  An 11 mm trocar was placed through a similar size  incision in the epigastrium and two 5 mm trocars placed in the right mid abdomen.  THe fundus of the gallbladder was grasped and retracted to the right ______.  The infundibulum was grasped and retracted laterally.  We could visualize the common bile duct and could note that it was enlarged.  I used careful blunt dissection and forward mobilized the infundibulum and noted the junction between the cystic duct and the gallbladder.  The cystic duct was somewhat dilated.  I isolated it.  I placed a clip at the cystic duct gallbladder junction.  I made a partial incision into the cystic duct and immediately bile under pressure followed by what appeared to be contrast material exuded.  I placed a cholangiocath to the anterior abdominal wall and performed a cholangiogram. What I found is she still had two retained common bile duct stones at the common bile duct even after her ERCP.  I subsequently tried passing a balloon down the cystic duct with a transcystic common bile duct approach.  I was able to crush and evacuate one of the stones.  The other one I was able to draw up higher to the common bile duct and then allowed contrast to pass.  I elected not to perform open common bile duct exploration because I felt that repeat ERCP would still be able to get this stone out.  I discussed this intraoperatively with Dr. Carman Ching.  I subsequently went ahead and clipped the cystic duct four times proximally and divided it.  The cystic artery was clipped twice proximally and once distally and divided.  The gallbladder was dissected free from the liver bed with the cautery.  The gallbladder was subsequently removed from the subumbilical port.  The hepatic area of the gallbladder fossa were irrigated.  No bleeding or bile leakage was noted.  The fluid was evacuated.  All trocars were removed and pneumoperitoneum released.  The subumbilical fascia defect was closed by tightening up and tying down  the pursestring suture.  The skin incisions were closed with 4-0 Monocryl subcuticular stitches, followed by Steri-Strips and a sterile dressing.  She tolerated the procedure without any apparent complications and was taken to the recovery room in satisfactory condition.  PLAN:  Repeat ERCP tomorrow morning and Dr. Laural Benes has been contacted regarding that.  I did discuss this with her family. DD:  11/04/99 TD:  11/07/99 Job: [id number]

## 2010-09-23 ENCOUNTER — Encounter (HOSPITAL_COMMUNITY): Payer: BC Managed Care – PPO

## 2010-09-23 ENCOUNTER — Encounter (HOSPITAL_COMMUNITY): Payer: BC Managed Care – PPO | Attending: Gastroenterology

## 2010-09-23 DIAGNOSIS — K5100 Ulcerative (chronic) pancolitis without complications: Secondary | ICD-10-CM | POA: Insufficient documentation

## 2010-11-18 ENCOUNTER — Encounter (HOSPITAL_COMMUNITY): Payer: BC Managed Care – PPO | Attending: Gastroenterology

## 2010-11-18 DIAGNOSIS — K5100 Ulcerative (chronic) pancolitis without complications: Secondary | ICD-10-CM | POA: Insufficient documentation

## 2011-01-04 ENCOUNTER — Other Ambulatory Visit: Payer: Self-pay | Admitting: Gynecology

## 2011-01-04 DIAGNOSIS — Z1231 Encounter for screening mammogram for malignant neoplasm of breast: Secondary | ICD-10-CM

## 2011-01-13 ENCOUNTER — Encounter (HOSPITAL_COMMUNITY): Payer: BC Managed Care – PPO | Attending: Gastroenterology

## 2011-01-13 ENCOUNTER — Encounter (HOSPITAL_COMMUNITY): Payer: BC Managed Care – PPO

## 2011-01-13 DIAGNOSIS — K5100 Ulcerative (chronic) pancolitis without complications: Secondary | ICD-10-CM | POA: Insufficient documentation

## 2011-01-24 ENCOUNTER — Ambulatory Visit
Admission: RE | Admit: 2011-01-24 | Discharge: 2011-01-24 | Disposition: A | Discharge status: Home / Self Care | Payer: BC Managed Care – PPO | Source: Ambulatory Visit | Attending: Gynecology | Admitting: Gynecology

## 2011-01-24 DIAGNOSIS — Z1231 Encounter for screening mammogram for malignant neoplasm of breast: Secondary | ICD-10-CM

## 2011-02-15 ENCOUNTER — Encounter (HOSPITAL_COMMUNITY): Payer: BC Managed Care – PPO

## 2011-02-21 ENCOUNTER — Other Ambulatory Visit: Payer: Self-pay | Admitting: *Deleted

## 2011-02-21 ENCOUNTER — Telehealth: Payer: Self-pay | Admitting: *Deleted

## 2011-02-21 DIAGNOSIS — Z3049 Encounter for surveillance of other contraceptives: Secondary | ICD-10-CM

## 2011-02-21 MED ORDER — LEVONORGESTREL 20 MCG/24HR IU IUD: 1.0000 | INTRAUTERINE_SYSTEM | Freq: Once | INTRAUTERINE | Status: DC

## 2011-02-21 NOTE — Telephone Encounter (Signed)
Patient had lm about checking benefits for Mirena IUD.  Called patient back and she will be covered 100% for remove/insert.  Will have appointments schedule after has annual exam.

## 2011-03-03 DIAGNOSIS — K529 Noninfective gastroenteritis and colitis, unspecified: Secondary | ICD-10-CM | POA: Insufficient documentation

## 2011-03-08 ENCOUNTER — Encounter: Payer: Self-pay | Admitting: Gynecology

## 2011-03-08 ENCOUNTER — Ambulatory Visit (INDEPENDENT_AMBULATORY_CARE_PROVIDER_SITE_OTHER): Payer: BC Managed Care – PPO | Admitting: Gynecology

## 2011-03-08 VITALS — BP 120/76 | Ht 67.0 in | Wt 222.0 lb

## 2011-03-08 DIAGNOSIS — N870 Mild cervical dysplasia: Secondary | ICD-10-CM | POA: Insufficient documentation

## 2011-03-08 DIAGNOSIS — L293 Anogenital pruritus, unspecified: Secondary | ICD-10-CM

## 2011-03-08 DIAGNOSIS — Z13220 Encounter for screening for lipoid disorders: Secondary | ICD-10-CM

## 2011-03-08 DIAGNOSIS — B3731 Acute candidiasis of vulva and vagina: Secondary | ICD-10-CM

## 2011-03-08 DIAGNOSIS — B373 Candidiasis of vulva and vagina: Secondary | ICD-10-CM

## 2011-03-08 DIAGNOSIS — Z30431 Encounter for routine checking of intrauterine contraceptive device: Secondary | ICD-10-CM

## 2011-03-08 DIAGNOSIS — Z131 Encounter for screening for diabetes mellitus: Secondary | ICD-10-CM

## 2011-03-08 DIAGNOSIS — N898 Other specified noninflammatory disorders of vagina: Secondary | ICD-10-CM

## 2011-03-08 DIAGNOSIS — Z01419 Encounter for gynecological examination (general) (routine) without abnormal findings: Secondary | ICD-10-CM

## 2011-03-08 MED ORDER — FLUCONAZOLE 200 MG PO TABS: 200.0000 mg | ORAL_TABLET | Freq: Every day | ORAL | Status: AC

## 2011-03-08 NOTE — Progress Notes (Signed)
Sarah Ramos 1965/04/17 [id number]        45 y.o.  for annual exam.  Mirena IUD due to have this exchanged the next week. Was recently treated for perianal itching by her dermatologist with steroids cream. She did ask to be checked for yeast infection.  Past medical history,surgical history, medications, allergies, family history and social history were all reviewed and documented in the EPIC chart. ROS:  Was performed and pertinent positives and negatives are included in the history.  Exam: chaperone present Filed Vitals:   03/08/11 1540  BP: 120/76   General appearance  Normal Skin grossly normal Head/Neck normal with no cervical or supraclavicular adenopathy thyroid normal Lungs  clear Cardiac RR, without RMG Abdominal  soft, nontender, without masses, organomegaly or hernia Breasts  examined lying and sitting without masses, retractions, discharge or axillary adenopathy. Pelvic  Ext/BUS/vagina  normal with white discharge.  Cervix  normal    Uterus  anteverted, normal size, shape and contour, midline and mobile nontender   Adnexa  Without masses or tenderness    Anus and perineum  normal   Rectovaginal  normal sphincter tone without palpated masses or tenderness.    Assessment/Plan:  45 y.o. female for annual exam.    1. White discharge/perianal itching. Wet prep is positive for yeast we'll treat with Diflucan 200 daily x5 days follow up if symptoms persist or recur 2. History of cervical dysplasia. Patient had a low grade SIL Pap smear in 2005 with colposcopy biopsy showing low-grade SIL. Her Pap smears have been negative since then. She did have an ASCUS Pap smear in 2008. HPV was not done but her subsequent annual Pap smears have been negative. I reviewed current screening guidelines recommended every 3 your screening. Her last Pap smear was 2011 she agrees with this and no Pap smear was done this year. 3. IUD management. Patient has an appointment next week to have her IUD  removed and replaced. She'll follow up for this. 4. Breast health. SBE monthly reviewed. She had mammography in October 2012 which is normal she'll continue with annual mammography. 5. Health maintenance. Baseline CBC, urinalysis, glucose, and profile ordered. Patient will follow up next week for her IUD switch.    Dara Lords MD, 4:32 PM 03/08/2011

## 2011-03-08 NOTE — Patient Instructions (Signed)
Follow up next week for your IUD switch back otherwise annually for GYN checkup

## 2011-03-13 ENCOUNTER — Encounter: Payer: Self-pay | Admitting: Gynecology

## 2011-03-13 ENCOUNTER — Ambulatory Visit (INDEPENDENT_AMBULATORY_CARE_PROVIDER_SITE_OTHER): Payer: BC Managed Care – PPO | Admitting: Gynecology

## 2011-03-13 VITALS — BP 124/78

## 2011-03-13 DIAGNOSIS — Z30431 Encounter for routine checking of intrauterine contraceptive device: Secondary | ICD-10-CM

## 2011-03-13 DIAGNOSIS — Z3049 Encounter for surveillance of other contraceptives: Secondary | ICD-10-CM

## 2011-03-13 MED ORDER — LEVONORGESTREL 20 MCG/24HR IU IUD: INTRAUTERINE_SYSTEM | Freq: Once | INTRAUTERINE | Status: AC

## 2011-03-13 NOTE — Patient Instructions (Signed)
Follow up in one month for post IUD insertional check up.

## 2011-03-13 NOTE — Progress Notes (Signed)
Patient presents to have her IUD removed and a new Mirena IUD replaced. I reviewed the removal and reinsertion process. I discussed the risks to include the acute risks of infection, uterine perforation requiring surgery to remove as well as long-term risks to include infection and failure risk with pregnancy. She has read through the booklet and signed the consent form. Her questions were answered.  Exam with chaperone present Pelvic external BUS vagina normal. Cervix normal. Uterus normal size anteverted midline mobile nontender.  Adnexa without masses or tenderness.  Procedure: Cervix was visualized and using a [place] forceps the IUD string, which was not initially visible, was grasped within the external os and the Mirena IUD was removed, shown to the patient and discarded. The cervix was then cleansed with Betadine, single-tooth tenaculum anterior lip stabilization, uterus was sounded and a new Mirena was placed according to manufacturer's recommendation.  The strings were trimmed at the patient tolerated well. She was instructed to follow up in a month for postinsertional check.

## 2011-03-14 ENCOUNTER — Other Ambulatory Visit (HOSPITAL_COMMUNITY): Payer: Self-pay | Admitting: *Deleted

## 2011-03-17 ENCOUNTER — Encounter (HOSPITAL_COMMUNITY)
Admission: RE | Admit: 2011-03-17 | Discharge: 2011-03-17 | Disposition: A | Discharge status: Home / Self Care | Payer: BC Managed Care – PPO | Source: Ambulatory Visit | Attending: Gastroenterology | Admitting: Gastroenterology

## 2011-03-17 DIAGNOSIS — K5100 Ulcerative (chronic) pancolitis without complications: Secondary | ICD-10-CM | POA: Insufficient documentation

## 2011-03-17 MED ORDER — SODIUM CHLORIDE 0.9 % IV SOLN
5.0000 mg/kg | INTRAVENOUS | Status: AC
  Administered 2011-03-17: 500 mg via INTRAVENOUS
  Filled 2011-03-17: qty 50

## 2011-04-12 ENCOUNTER — Encounter: Payer: Self-pay | Admitting: *Deleted

## 2011-04-14 ENCOUNTER — Encounter: Payer: Self-pay | Admitting: Gynecology

## 2011-04-14 ENCOUNTER — Ambulatory Visit (INDEPENDENT_AMBULATORY_CARE_PROVIDER_SITE_OTHER): Payer: BC Managed Care – PPO | Admitting: Gynecology

## 2011-04-14 VITALS — BP 130/90

## 2011-04-14 DIAGNOSIS — Z30431 Encounter for routine checking of intrauterine contraceptive device: Secondary | ICD-10-CM

## 2011-04-14 DIAGNOSIS — N760 Acute vaginitis: Secondary | ICD-10-CM

## 2011-04-14 DIAGNOSIS — A499 Bacterial infection, unspecified: Secondary | ICD-10-CM

## 2011-04-14 DIAGNOSIS — N898 Other specified noninflammatory disorders of vagina: Secondary | ICD-10-CM

## 2011-04-14 LAB — WET PREP FOR TRICH, YEAST, CLUE
Clue Cells Wet Prep HPF POC: NONE SEEN
Trich, Wet Prep: NONE SEEN

## 2011-04-14 MED ORDER — CLINDAMYCIN PHOSPHATE 2 % VA CREA: 1.0000 | TOPICAL_CREAM | Freq: Every day | VAGINAL | Status: AC

## 2011-04-14 NOTE — Progress Notes (Signed)
Patient presents in follow up of her IUD placement. She's doing well from that standpoint. She also notes though a vaginal discharge with some irritation.  Exam with Elane Fritz chaperone present External BUS vagina with slight white discharge. Cervix normal IUD string appropriate length. Bimanual uterus normal size midline mobile nontender adnexa without masses or tenderness.  Assessment and plan 1. IUD management. Patient's doing well we'll follow assuming continues well and she'll see me in a year which is due for her annual. 2. Vaginal irritation slight discharge. Care was repress positive for BV. We'll treat with Cleocin vaginal cream nightly x7 days follow up if symptoms persist or recur.

## 2011-04-14 NOTE — Patient Instructions (Addendum)
Use vaginal cream as discussed.  Follow up in one year for check up

## 2011-05-05 ENCOUNTER — Encounter (HOSPITAL_COMMUNITY): Payer: BC Managed Care – PPO

## 2011-05-10 ENCOUNTER — Other Ambulatory Visit: Payer: Self-pay | Admitting: Gastroenterology

## 2011-05-10 DIAGNOSIS — K5100 Ulcerative (chronic) pancolitis without complications: Secondary | ICD-10-CM

## 2011-05-10 MED ORDER — INFLIXIMAB 100 MG IV SOLR: INTRAVENOUS | Status: DC

## 2011-05-12 ENCOUNTER — Encounter (HOSPITAL_COMMUNITY)
Admission: RE | Admit: 2011-05-12 | Discharge: 2011-05-12 | Disposition: A | Discharge status: Home / Self Care | Payer: BC Managed Care – PPO | Source: Ambulatory Visit | Attending: Gastroenterology | Admitting: Gastroenterology

## 2011-05-12 DIAGNOSIS — K5100 Ulcerative (chronic) pancolitis without complications: Secondary | ICD-10-CM | POA: Insufficient documentation

## 2011-05-12 MED ORDER — SODIUM CHLORIDE 0.9 % IV SOLN
5.0000 mg/kg | INTRAVENOUS | Status: DC
  Administered 2011-05-12: 500 mg via INTRAVENOUS
  Filled 2011-05-12: qty 50

## 2011-05-18 ENCOUNTER — Telehealth: Payer: Self-pay | Admitting: *Deleted

## 2011-05-18 DIAGNOSIS — E7800 Pure hypercholesterolemia, unspecified: Secondary | ICD-10-CM

## 2011-05-18 NOTE — Telephone Encounter (Signed)
Pt was contacted on 03/07/12 that her chol. Was elevated and to come back to fasting lipid profile. Order was never placed. Order placed in computer, vm transferred to appointment desk for pt to schedule.

## 2011-05-22 ENCOUNTER — Other Ambulatory Visit: Payer: BC Managed Care – PPO

## 2011-05-22 DIAGNOSIS — E7800 Pure hypercholesterolemia, unspecified: Secondary | ICD-10-CM

## 2011-05-23 LAB — LIPID PANEL: HDL: 45 mg/dL (ref >39)

## 2011-05-25 ENCOUNTER — Telehealth: Payer: Self-pay | Admitting: Gynecology

## 2011-05-25 ENCOUNTER — Other Ambulatory Visit: Payer: Self-pay | Admitting: *Deleted

## 2011-05-25 DIAGNOSIS — E7800 Pure hypercholesterolemia, unspecified: Secondary | ICD-10-CM

## 2011-05-25 NOTE — Telephone Encounter (Signed)
Cholesterol results given to pt over phone as she needed them for her wellness,results had to be in by 5pm 05/24/11.

## 2011-06-30 ENCOUNTER — Other Ambulatory Visit: Payer: Self-pay | Admitting: Gastroenterology

## 2011-07-07 ENCOUNTER — Encounter (HOSPITAL_COMMUNITY)
Admission: RE | Admit: 2011-07-07 | Discharge: 2011-07-07 | Disposition: A | Discharge status: Home / Self Care | Payer: BC Managed Care – PPO | Source: Ambulatory Visit | Attending: Gastroenterology | Admitting: Gastroenterology

## 2011-07-07 DIAGNOSIS — K5100 Ulcerative (chronic) pancolitis without complications: Secondary | ICD-10-CM | POA: Insufficient documentation

## 2011-07-07 MED ORDER — SODIUM CHLORIDE 0.9 % IV SOLN
Freq: Once | INTRAVENOUS | Status: AC
  Administered 2011-07-07: 09:00:00 via INTRAVENOUS

## 2011-07-07 MED ORDER — SODIUM CHLORIDE 0.9 % IV SOLN
5.0000 mg/kg | INTRAVENOUS | Status: AC
  Administered 2011-07-07: 500 mg via INTRAVENOUS
  Filled 2011-07-07: qty 50

## 2011-09-01 ENCOUNTER — Encounter (HOSPITAL_COMMUNITY): Payer: BC Managed Care – PPO

## 2011-09-08 ENCOUNTER — Encounter (HOSPITAL_COMMUNITY)
Admission: RE | Admit: 2011-09-08 | Discharge: 2011-09-08 | Disposition: A | Discharge status: Home / Self Care | Payer: BC Managed Care – PPO | Source: Ambulatory Visit | Attending: Gastroenterology | Admitting: Gastroenterology

## 2011-09-08 DIAGNOSIS — K5100 Ulcerative (chronic) pancolitis without complications: Secondary | ICD-10-CM | POA: Insufficient documentation

## 2011-09-08 MED ORDER — SODIUM CHLORIDE 0.9 % IV SOLN
INTRAVENOUS | Status: DC
  Administered 2011-09-08: 10:00:00 via INTRAVENOUS

## 2011-09-08 MED ORDER — SODIUM CHLORIDE 0.9 % IV SOLN
5.0000 mg/kg | INTRAVENOUS | Status: DC
  Administered 2011-09-08: 500 mg via INTRAVENOUS
  Filled 2011-09-08: qty 50

## 2011-09-19 ENCOUNTER — Other Ambulatory Visit: Payer: Self-pay | Admitting: Family Medicine

## 2011-09-19 ENCOUNTER — Ambulatory Visit
Admission: RE | Admit: 2011-09-19 | Discharge: 2011-09-19 | Disposition: A | Discharge status: Home / Self Care | Payer: BC Managed Care – PPO | Source: Ambulatory Visit | Attending: Family Medicine | Admitting: Family Medicine

## 2011-09-19 DIAGNOSIS — M549 Dorsalgia, unspecified: Secondary | ICD-10-CM

## 2011-09-19 DIAGNOSIS — M533 Sacrococcygeal disorders, not elsewhere classified: Secondary | ICD-10-CM

## 2011-11-03 ENCOUNTER — Encounter (HOSPITAL_COMMUNITY)
Admission: RE | Admit: 2011-11-03 | Discharge: 2011-11-03 | Disposition: A | Discharge status: Home / Self Care | Payer: BC Managed Care – PPO | Source: Ambulatory Visit | Attending: Gastroenterology | Admitting: Gastroenterology

## 2011-11-03 DIAGNOSIS — K5100 Ulcerative (chronic) pancolitis without complications: Secondary | ICD-10-CM | POA: Insufficient documentation

## 2011-11-03 MED ORDER — SODIUM CHLORIDE 0.9 % IV SOLN
5.0000 mg/kg | INTRAVENOUS | Status: DC
  Administered 2011-11-03: 500 mg via INTRAVENOUS
  Filled 2011-11-03: qty 50

## 2011-11-03 MED ORDER — SODIUM CHLORIDE 0.9 % IV SOLN
INTRAVENOUS | Status: DC
  Administered 2011-11-03: 10:00:00 via INTRAVENOUS

## 2011-12-28 ENCOUNTER — Other Ambulatory Visit (HOSPITAL_COMMUNITY): Payer: Self-pay | Admitting: *Deleted

## 2011-12-29 ENCOUNTER — Encounter (HOSPITAL_COMMUNITY)
Admission: RE | Admit: 2011-12-29 | Discharge: 2011-12-29 | Disposition: A | Discharge status: Home / Self Care | Payer: BC Managed Care – PPO | Source: Ambulatory Visit | Attending: Gastroenterology | Admitting: Gastroenterology

## 2011-12-29 DIAGNOSIS — K5100 Ulcerative (chronic) pancolitis without complications: Secondary | ICD-10-CM | POA: Insufficient documentation

## 2011-12-29 MED ORDER — SODIUM CHLORIDE 0.9 % IV SOLN
INTRAVENOUS | Status: DC
  Administered 2011-12-29: 10:00:00 via INTRAVENOUS

## 2011-12-29 MED ORDER — SODIUM CHLORIDE 0.9 % IV SOLN
5.0000 mg/kg | INTRAVENOUS | Status: DC
  Administered 2011-12-29: 500 mg via INTRAVENOUS
  Filled 2011-12-29: qty 50

## 2012-02-21 ENCOUNTER — Other Ambulatory Visit (HOSPITAL_COMMUNITY): Payer: Self-pay | Admitting: *Deleted

## 2012-02-23 ENCOUNTER — Encounter (HOSPITAL_COMMUNITY)
Admission: RE | Admit: 2012-02-23 | Discharge: 2012-02-23 | Disposition: A | Discharge status: Home / Self Care | Payer: BC Managed Care – PPO | Source: Ambulatory Visit | Attending: Gastroenterology | Admitting: Gastroenterology

## 2012-02-23 DIAGNOSIS — K5100 Ulcerative (chronic) pancolitis without complications: Secondary | ICD-10-CM | POA: Insufficient documentation

## 2012-02-23 MED ORDER — SODIUM CHLORIDE 0.9 % IV SOLN
INTRAVENOUS | Status: DC
  Administered 2012-02-23: 10:00:00 via INTRAVENOUS

## 2012-02-23 MED ORDER — SODIUM CHLORIDE 0.9 % IV SOLN
5.0000 mg/kg | INTRAVENOUS | Status: DC
  Administered 2012-02-23: 500 mg via INTRAVENOUS
  Filled 2012-02-23: qty 50

## 2012-03-07 ENCOUNTER — Other Ambulatory Visit: Payer: Self-pay | Admitting: Gynecology

## 2012-03-07 DIAGNOSIS — Z1231 Encounter for screening mammogram for malignant neoplasm of breast: Secondary | ICD-10-CM

## 2012-04-11 ENCOUNTER — Ambulatory Visit
Admission: RE | Admit: 2012-04-11 | Discharge: 2012-04-11 | Disposition: A | Discharge status: Home / Self Care | Payer: BC Managed Care – PPO | Source: Ambulatory Visit | Attending: Gynecology | Admitting: Gynecology

## 2012-04-11 DIAGNOSIS — Z1231 Encounter for screening mammogram for malignant neoplasm of breast: Secondary | ICD-10-CM

## 2012-04-19 ENCOUNTER — Encounter (HOSPITAL_COMMUNITY): Payer: BC Managed Care – PPO

## 2012-06-18 ENCOUNTER — Encounter: Payer: BC Managed Care – PPO | Admitting: Gynecology

## 2012-07-03 ENCOUNTER — Other Ambulatory Visit (HOSPITAL_COMMUNITY)
Admission: RE | Admit: 2012-07-03 | Discharge: 2012-07-03 | Disposition: A | Discharge status: Home / Self Care | Payer: BC Managed Care – PPO | Source: Ambulatory Visit | Attending: Gynecology | Admitting: Gynecology

## 2012-07-03 ENCOUNTER — Ambulatory Visit (INDEPENDENT_AMBULATORY_CARE_PROVIDER_SITE_OTHER): Payer: BC Managed Care – PPO | Admitting: Gynecology

## 2012-07-03 ENCOUNTER — Encounter: Payer: Self-pay | Admitting: Gynecology

## 2012-07-03 VITALS — BP 120/74 | Ht 67.0 in | Wt 215.0 lb

## 2012-07-03 DIAGNOSIS — M797 Fibromyalgia: Secondary | ICD-10-CM

## 2012-07-03 DIAGNOSIS — IMO0001 Reserved for inherently not codable concepts without codable children: Secondary | ICD-10-CM

## 2012-07-03 DIAGNOSIS — Z01419 Encounter for gynecological examination (general) (routine) without abnormal findings: Secondary | ICD-10-CM

## 2012-07-03 DIAGNOSIS — L293 Anogenital pruritus, unspecified: Secondary | ICD-10-CM

## 2012-07-03 DIAGNOSIS — E039 Hypothyroidism, unspecified: Secondary | ICD-10-CM

## 2012-07-03 DIAGNOSIS — Z30431 Encounter for routine checking of intrauterine contraceptive device: Secondary | ICD-10-CM

## 2012-07-03 DIAGNOSIS — L292 Pruritus vulvae: Secondary | ICD-10-CM

## 2012-07-03 DIAGNOSIS — Z1151 Encounter for screening for human papillomavirus (HPV): Secondary | ICD-10-CM | POA: Insufficient documentation

## 2012-07-03 LAB — WET PREP FOR TRICH, YEAST, CLUE: Trich, Wet Prep: NONE SEEN

## 2012-07-03 LAB — TSH: TSH: 1.76 u[IU]/mL (ref 0.350–4.500)

## 2012-07-03 MED ORDER — METRONIDAZOLE 500 MG PO TABS: 500.0000 mg | ORAL_TABLET | Freq: Two times a day (BID) | ORAL | Status: DC

## 2012-07-03 NOTE — Progress Notes (Signed)
LAKHIA GENGLER Jul 29, 1965 [id number]        47 y.o.  G1P1001 for annual exam.  Recently diagnosed with fibromyalgia.  Several issues noted below.  Past medical history,surgical history, medications, allergies, family history and social history were all reviewed and documented in the EPIC chart. ROS:  Was performed and pertinent positives and negatives are included in the history.  Exam: Kim assistant Filed Vitals:   07/03/12 1528  BP: 120/74  Height: 5\' 7"  (1.702 m)  Weight: 215 lb (97.523 kg)   General appearance  Normal Skin grossly normal Head/Neck normal with no cervical or supraclavicular adenopathy thyroid normal Lungs  clear Cardiac RR, without RMG Abdominal  soft, nontender, without masses, organomegaly or hernia Breasts  examined lying and sitting without masses, retractions, discharge or axillary adenopathy. Pelvic  Ext/BUS/vagina  normal with slight white discharge.  Cervix  normal with IUD string visualized. Pap/HPV  Uterus  anteverted, normal size, shape and contour, midline and mobile nontender   Adnexa  Without masses or tenderness    Anus and perineum  normal   Rectovaginal  normal sphincter tone without palpated masses or tenderness.    Assessment/Plan:  47 y.o. G73P1001 female for annual exam.   1. Complaining of vulvar itching. No discharge odor or other symptoms. Wet prep consistent with early bacterial vaginosis. Treat with Flagyl 500 mg twice daily for 7 days follow up if symptoms persist, worse in or recur.  Alcohol avoidance reviewed. 2. Mirena IUD 02/2011. Amenorrhea doing well. IUD string visualized. 3. Mammography 03/2012. Continued annual mammography. SBE monthly reviewed. 4. Pap smear 2011. Pap/HPV done today. History of CIN-1 1991 with cryosurgery. Subsequent Pap smear CIN-1 in 2005 with biopsy showing low-grade SIL. Subsequent Pap smears all normal. 5. Health maintenance. Patient reports having glucose cholesterol and other blood work through her  other physicians.  Unsure thyroid was ever checked and she does have a family history of hypothyroidism. She asked about going check this I ordered this along with urinalysis. No other blood work done as again she is confident that it was all done through her other physician's office. Follow up one year, sooner as needed.    Dara Lords MD, 4:11 PM 07/03/2012

## 2012-07-03 NOTE — Patient Instructions (Signed)
Follow up in one year for annual exam 

## 2012-11-28 ENCOUNTER — Other Ambulatory Visit (HOSPITAL_COMMUNITY): Payer: Self-pay | Admitting: *Deleted

## 2012-11-29 ENCOUNTER — Encounter (HOSPITAL_COMMUNITY)
Admission: RE | Admit: 2012-11-29 | Discharge: 2012-11-29 | Disposition: A | Discharge status: Home / Self Care | Payer: BC Managed Care – PPO | Source: Ambulatory Visit | Attending: Gastroenterology | Admitting: Gastroenterology

## 2012-11-29 DIAGNOSIS — K5100 Ulcerative (chronic) pancolitis without complications: Secondary | ICD-10-CM | POA: Insufficient documentation

## 2012-11-29 MED ORDER — SODIUM CHLORIDE 0.9 % IV SOLN
INTRAVENOUS | Status: DC
  Administered 2012-11-29: 250 mL via INTRAVENOUS

## 2012-11-29 MED ORDER — SODIUM CHLORIDE 0.9 % IV SOLN
5.0000 mg/kg | INTRAVENOUS | Status: DC
  Administered 2012-11-29: 500 mg via INTRAVENOUS
  Filled 2012-11-29: qty 50

## 2013-01-23 ENCOUNTER — Other Ambulatory Visit (HOSPITAL_COMMUNITY): Payer: Self-pay | Admitting: *Deleted

## 2013-01-24 ENCOUNTER — Encounter (HOSPITAL_COMMUNITY)
Admission: RE | Admit: 2013-01-24 | Discharge: 2013-01-24 | Disposition: A | Discharge status: Home / Self Care | Payer: BC Managed Care – PPO | Source: Ambulatory Visit | Attending: Gastroenterology | Admitting: Gastroenterology

## 2013-01-24 DIAGNOSIS — K5100 Ulcerative (chronic) pancolitis without complications: Secondary | ICD-10-CM | POA: Insufficient documentation

## 2013-01-24 MED ORDER — SODIUM CHLORIDE 0.9 % IV SOLN
INTRAVENOUS | Status: DC
  Administered 2013-01-24: 09:00:00 via INTRAVENOUS

## 2013-01-24 MED ORDER — SODIUM CHLORIDE 0.9 % IV SOLN
5.0000 mg/kg | INTRAVENOUS | Status: DC
  Administered 2013-01-24: 500 mg via INTRAVENOUS
  Filled 2013-01-24: qty 50

## 2013-03-18 ENCOUNTER — Other Ambulatory Visit (HOSPITAL_COMMUNITY): Payer: Self-pay | Admitting: *Deleted

## 2013-03-21 ENCOUNTER — Encounter (HOSPITAL_COMMUNITY)
Admission: RE | Admit: 2013-03-21 | Discharge: 2013-03-21 | Disposition: A | Discharge status: Home / Self Care | Payer: BC Managed Care – PPO | Source: Ambulatory Visit | Attending: Gastroenterology | Admitting: Gastroenterology

## 2013-03-21 DIAGNOSIS — K5100 Ulcerative (chronic) pancolitis without complications: Secondary | ICD-10-CM | POA: Insufficient documentation

## 2013-03-21 MED ORDER — SODIUM CHLORIDE 0.9 % IV SOLN
INTRAVENOUS | Status: DC
  Administered 2013-03-21: 09:00:00 via INTRAVENOUS

## 2013-03-21 MED ORDER — INFLIXIMAB 100 MG IV SOLR
5.0000 mg/kg | INTRAVENOUS | Status: DC
  Administered 2013-03-21: 500 mg via INTRAVENOUS
  Filled 2013-03-21: qty 50

## 2013-05-15 ENCOUNTER — Other Ambulatory Visit (HOSPITAL_COMMUNITY): Payer: Self-pay | Admitting: *Deleted

## 2013-05-16 ENCOUNTER — Encounter (HOSPITAL_COMMUNITY)
Admission: RE | Admit: 2013-05-16 | Discharge: 2013-05-16 | Disposition: A | Discharge status: Home / Self Care | Payer: BC Managed Care – PPO | Source: Ambulatory Visit | Attending: Gastroenterology | Admitting: Gastroenterology

## 2013-05-16 DIAGNOSIS — K5100 Ulcerative (chronic) pancolitis without complications: Secondary | ICD-10-CM | POA: Insufficient documentation

## 2013-05-16 MED ORDER — SODIUM CHLORIDE 0.9 % IV SOLN
INTRAVENOUS | Status: DC
  Administered 2013-05-16: 09:00:00 via INTRAVENOUS

## 2013-05-16 MED ORDER — SODIUM CHLORIDE 0.9 % IV SOLN
5.0000 mg/kg | INTRAVENOUS | Status: DC
  Administered 2013-05-16: 500 mg via INTRAVENOUS
  Filled 2013-05-16: qty 50

## 2013-07-10 ENCOUNTER — Other Ambulatory Visit (HOSPITAL_COMMUNITY): Payer: Self-pay | Admitting: *Deleted

## 2013-07-11 ENCOUNTER — Encounter (HOSPITAL_COMMUNITY)
Admission: RE | Admit: 2013-07-11 | Discharge: 2013-07-11 | Disposition: A | Discharge status: Home / Self Care | Payer: BC Managed Care – PPO | Source: Ambulatory Visit | Attending: Gastroenterology | Admitting: Gastroenterology

## 2013-07-11 DIAGNOSIS — K5100 Ulcerative (chronic) pancolitis without complications: Secondary | ICD-10-CM | POA: Insufficient documentation

## 2013-07-11 MED ORDER — SODIUM CHLORIDE 0.9 % IV SOLN
5.0000 mg/kg | Freq: Once | INTRAVENOUS | Status: AC
  Administered 2013-07-11: 500 mg via INTRAVENOUS
  Filled 2013-07-11: qty 50

## 2013-07-11 MED ORDER — SODIUM CHLORIDE 0.9 % IV SOLN
Freq: Once | INTRAVENOUS | Status: AC
  Administered 2013-07-11: 09:00:00 via INTRAVENOUS

## 2013-09-04 ENCOUNTER — Other Ambulatory Visit (HOSPITAL_COMMUNITY): Payer: Self-pay | Admitting: *Deleted

## 2013-09-05 ENCOUNTER — Encounter (HOSPITAL_COMMUNITY)
Admission: RE | Admit: 2013-09-05 | Discharge: 2013-09-05 | Disposition: A | Discharge status: Home / Self Care | Payer: BC Managed Care – PPO | Source: Ambulatory Visit | Attending: Gastroenterology | Admitting: Gastroenterology

## 2013-09-05 DIAGNOSIS — K5100 Ulcerative (chronic) pancolitis without complications: Secondary | ICD-10-CM | POA: Insufficient documentation

## 2013-09-05 MED ORDER — SODIUM CHLORIDE 0.9 % IV SOLN
5.0000 mg/kg | INTRAVENOUS | Status: DC
  Administered 2013-09-05: 500 mg via INTRAVENOUS
  Filled 2013-09-05: qty 50

## 2013-09-05 MED ORDER — SODIUM CHLORIDE 0.9 % IV SOLN
INTRAVENOUS | Status: DC
  Administered 2013-09-05: 09:00:00 via INTRAVENOUS

## 2013-09-10 ENCOUNTER — Other Ambulatory Visit: Payer: Self-pay

## 2013-10-31 ENCOUNTER — Encounter (HOSPITAL_COMMUNITY)
Admission: RE | Admit: 2013-10-31 | Discharge: 2013-10-31 | Disposition: A | Discharge status: Home / Self Care | Payer: BC Managed Care – PPO | Source: Ambulatory Visit | Attending: Gastroenterology | Admitting: Gastroenterology

## 2013-10-31 DIAGNOSIS — K5100 Ulcerative (chronic) pancolitis without complications: Secondary | ICD-10-CM | POA: Insufficient documentation

## 2013-10-31 MED ORDER — SODIUM CHLORIDE 0.9 % IV SOLN
5.0000 mg/kg | INTRAVENOUS | Status: AC
  Administered 2013-10-31: 500 mg via INTRAVENOUS
  Filled 2013-10-31: qty 50

## 2013-10-31 MED ORDER — SODIUM CHLORIDE 0.9 % IV SOLN
INTRAVENOUS | Status: DC
  Administered 2013-10-31: 250 mL via INTRAVENOUS

## 2013-12-26 ENCOUNTER — Encounter (HOSPITAL_COMMUNITY)
Admission: RE | Admit: 2013-12-26 | Discharge: 2013-12-26 | Disposition: A | Discharge status: Home / Self Care | Payer: BC Managed Care – PPO | Source: Ambulatory Visit | Attending: Gastroenterology | Admitting: Gastroenterology

## 2013-12-26 DIAGNOSIS — K5190 Ulcerative colitis, unspecified, without complications: Secondary | ICD-10-CM | POA: Insufficient documentation

## 2013-12-26 MED ORDER — SODIUM CHLORIDE 0.9 % IV SOLN
Freq: Once | INTRAVENOUS | Status: AC
  Administered 2013-12-26: 09:00:00 via INTRAVENOUS

## 2013-12-26 MED ORDER — SODIUM CHLORIDE 0.9 % IV SOLN
5.0000 mg/kg | Freq: Once | INTRAVENOUS | Status: AC
  Administered 2013-12-26: 600 mg via INTRAVENOUS
  Filled 2013-12-26: qty 60

## 2014-01-26 ENCOUNTER — Encounter: Payer: Self-pay | Admitting: Gynecology

## 2014-01-27 ENCOUNTER — Other Ambulatory Visit: Payer: Self-pay

## 2014-01-27 DIAGNOSIS — Z1231 Encounter for screening mammogram for malignant neoplasm of breast: Secondary | ICD-10-CM

## 2014-02-13 ENCOUNTER — Ambulatory Visit
Admission: RE | Admit: 2014-02-13 | Discharge: 2014-02-13 | Disposition: A | Discharge status: Home / Self Care | Payer: BC Managed Care – PPO | Source: Ambulatory Visit

## 2014-02-13 DIAGNOSIS — Z1231 Encounter for screening mammogram for malignant neoplasm of breast: Secondary | ICD-10-CM

## 2014-02-18 ENCOUNTER — Other Ambulatory Visit (HOSPITAL_COMMUNITY): Payer: Self-pay | Admitting: *Deleted

## 2014-02-20 ENCOUNTER — Encounter (HOSPITAL_COMMUNITY)
Admission: RE | Admit: 2014-02-20 | Discharge: 2014-02-20 | Disposition: A | Discharge status: Home / Self Care | Payer: BC Managed Care – PPO | Source: Ambulatory Visit | Attending: Gastroenterology | Admitting: Gastroenterology

## 2014-02-20 DIAGNOSIS — K5190 Ulcerative colitis, unspecified, without complications: Secondary | ICD-10-CM | POA: Insufficient documentation

## 2014-02-20 MED ORDER — SODIUM CHLORIDE 0.9 % IV SOLN
INTRAVENOUS | Status: DC
  Administered 2014-02-20: 08:00:00 via INTRAVENOUS

## 2014-02-20 MED ORDER — INFLIXIMAB 100 MG IV SOLR
5.0000 mg/kg | INTRAVENOUS | Status: DC
  Administered 2014-02-20: 600 mg via INTRAVENOUS
  Filled 2014-02-20: qty 60

## 2014-04-17 ENCOUNTER — Inpatient Hospital Stay (HOSPITAL_COMMUNITY): Admission: RE | Admit: 2014-04-17 | Payer: BC Managed Care – PPO | Source: Ambulatory Visit

## 2014-04-23 ENCOUNTER — Other Ambulatory Visit (HOSPITAL_COMMUNITY): Payer: Self-pay | Admitting: *Deleted

## 2014-04-24 ENCOUNTER — Encounter (HOSPITAL_COMMUNITY)
Admission: RE | Admit: 2014-04-24 | Discharge: 2014-04-24 | Disposition: A | Discharge status: Home / Self Care | Payer: BLUE CROSS/BLUE SHIELD | Source: Ambulatory Visit | Attending: Gastroenterology | Admitting: Gastroenterology

## 2014-04-24 DIAGNOSIS — K5190 Ulcerative colitis, unspecified, without complications: Secondary | ICD-10-CM | POA: Insufficient documentation

## 2014-04-24 MED ORDER — SODIUM CHLORIDE 0.9 % IV SOLN
5.0000 mg/kg | INTRAVENOUS | Status: AC
  Administered 2014-04-24: 600 mg via INTRAVENOUS
  Filled 2014-04-24: qty 60

## 2014-04-24 MED ORDER — SODIUM CHLORIDE 0.9 % IV SOLN
INTRAVENOUS | Status: AC
  Administered 2014-04-24: 09:00:00 via INTRAVENOUS

## 2014-06-17 ENCOUNTER — Other Ambulatory Visit (HOSPITAL_COMMUNITY): Payer: Self-pay | Admitting: *Deleted

## 2014-06-18 ENCOUNTER — Encounter (HOSPITAL_COMMUNITY)
Admission: RE | Admit: 2014-06-18 | Discharge: 2014-06-18 | Disposition: A | Discharge status: Home / Self Care | Payer: BLUE CROSS/BLUE SHIELD | Source: Ambulatory Visit | Attending: Gastroenterology | Admitting: Gastroenterology

## 2014-06-18 DIAGNOSIS — K5190 Ulcerative colitis, unspecified, without complications: Secondary | ICD-10-CM | POA: Diagnosis not present

## 2014-06-18 MED ORDER — SODIUM CHLORIDE 0.9 % IV SOLN
INTRAVENOUS | Status: DC
  Administered 2014-06-18: 09:00:00 via INTRAVENOUS

## 2014-06-18 MED ORDER — INFLIXIMAB 100 MG IV SOLR
5.0000 mg/kg | INTRAVENOUS | Status: DC
  Administered 2014-06-18: 600 mg via INTRAVENOUS
  Filled 2014-06-18: qty 60

## 2014-06-19 ENCOUNTER — Encounter (HOSPITAL_COMMUNITY): Payer: Self-pay

## 2014-06-25 ENCOUNTER — Other Ambulatory Visit: Payer: Self-pay | Admitting: Gastroenterology

## 2014-06-25 DIAGNOSIS — R2231 Localized swelling, mass and lump, right upper limb: Secondary | ICD-10-CM

## 2014-06-25 DIAGNOSIS — K51918 Ulcerative colitis, unspecified with other complication: Secondary | ICD-10-CM

## 2014-06-30 ENCOUNTER — Encounter (HOSPITAL_COMMUNITY): Payer: Self-pay

## 2014-06-30 ENCOUNTER — Ambulatory Visit (HOSPITAL_COMMUNITY)
Admission: RE | Admit: 2014-06-30 | Discharge: 2014-06-30 | Disposition: A | Discharge status: Home / Self Care | Payer: BLUE CROSS/BLUE SHIELD | Source: Ambulatory Visit | Attending: Gastroenterology | Admitting: Gastroenterology

## 2014-06-30 DIAGNOSIS — K51918 Ulcerative colitis, unspecified with other complication: Secondary | ICD-10-CM

## 2014-06-30 DIAGNOSIS — R2231 Localized swelling, mass and lump, right upper limb: Secondary | ICD-10-CM | POA: Insufficient documentation

## 2014-06-30 MED ORDER — IOHEXOL 300 MG/ML  SOLN
75.0000 mL | Freq: Once | INTRAMUSCULAR | Status: AC | PRN
  Administered 2014-06-30: 75 mL via INTRAVENOUS

## 2014-07-14 ENCOUNTER — Encounter: Payer: Self-pay | Admitting: Gynecology

## 2014-07-14 ENCOUNTER — Ambulatory Visit (INDEPENDENT_AMBULATORY_CARE_PROVIDER_SITE_OTHER): Payer: BLUE CROSS/BLUE SHIELD | Admitting: Gynecology

## 2014-07-14 VITALS — BP 130/84 | Ht 67.0 in | Wt 251.0 lb

## 2014-07-14 DIAGNOSIS — N951 Menopausal and female climacteric states: Secondary | ICD-10-CM | POA: Diagnosis not present

## 2014-07-14 DIAGNOSIS — Z30431 Encounter for routine checking of intrauterine contraceptive device: Secondary | ICD-10-CM | POA: Diagnosis not present

## 2014-07-14 DIAGNOSIS — Z01419 Encounter for gynecological examination (general) (routine) without abnormal findings: Secondary | ICD-10-CM

## 2014-07-14 LAB — CBC WITH DIFFERENTIAL/PLATELET
Basophils Absolute: 0 10*3/uL (ref 0.0–0.1)
Basophils Relative: 0 % (ref 0–1)
Eosinophils Absolute: 0.3 10*3/uL (ref 0.0–0.7)
Eosinophils Relative: 3 % (ref 0–5)
HCT: 40.8 % (ref 36.0–46.0)
Hemoglobin: 13.9 g/dL (ref 12.0–15.0)
LYMPHS PCT: 37 % (ref 12–46)
Lymphs Abs: 3.4 10*3/uL (ref 0.7–4.0)
MCH: 32 pg (ref 26.0–34.0)
MCHC: 34.1 g/dL (ref 30.0–36.0)
MCV: 93.8 fL (ref 78.0–100.0)
MONO ABS: 0.5 10*3/uL (ref 0.1–1.0)
MONOS PCT: 5 % (ref 3–12)
MPV: 10.3 fL (ref 8.6–12.4)
Neutro Abs: 5 10*3/uL (ref 1.7–7.7)
Neutrophils Relative %: 55 % (ref 43–77)
Platelets: 316 10*3/uL (ref 150–400)
RBC: 4.35 MIL/uL (ref 3.87–5.11)
RDW: 13.7 % (ref 11.5–15.5)
WBC: 9.1 10*3/uL (ref 4.0–10.5)

## 2014-07-14 LAB — LIPID PANEL
Cholesterol: 177 mg/dL (ref 0–200)
HDL: 47 mg/dL (ref >=46)
LDL Cholesterol: 103 mg/dL — ABNORMAL HIGH (ref 0–99)
TRIGLYCERIDES: 137 mg/dL (ref <150)
Total CHOL/HDL Ratio: 3.8 Ratio
VLDL: 27 mg/dL (ref 0–40)

## 2014-07-14 LAB — COMPREHENSIVE METABOLIC PANEL
ALT: 21 U/L (ref 0–35)
AST: 20 U/L (ref 0–37)
Albumin: 4.2 g/dL (ref 3.5–5.2)
Alkaline Phosphatase: 58 U/L (ref 39–117)
BILIRUBIN TOTAL: 0.5 mg/dL (ref 0.2–1.2)
BUN: 10 mg/dL (ref 6–23)
CALCIUM: 8.8 mg/dL (ref 8.4–10.5)
CHLORIDE: 104 meq/L (ref 96–112)
CO2: 19 mEq/L (ref 19–32)
CREATININE: 0.67 mg/dL (ref 0.50–1.10)
Glucose, Bld: 90 mg/dL (ref 70–99)
Potassium: 4.3 mEq/L (ref 3.5–5.3)
Sodium: 137 mEq/L (ref 135–145)
Total Protein: 6.8 g/dL (ref 6.0–8.3)

## 2014-07-14 LAB — TSH: TSH: 2.461 u[IU]/mL (ref 0.350–4.500)

## 2014-07-14 NOTE — Patient Instructions (Signed)
You may obtain a copy of any labs that were done today by logging onto MyChart as outlined in the instructions provided with your AVS (after visit summary). The office will not call with normal lab results but certainly if there are any significant abnormalities then we will contact you.   Health Maintenance, Female A healthy lifestyle and preventative care can promote health and wellness.  Maintain regular health, dental, and eye exams.  Eat a healthy diet. Foods like vegetables, fruits, whole grains, low-fat dairy products, and lean protein foods contain the nutrients you need without too many calories. Decrease your intake of foods high in solid fats, added sugars, and salt. Get information about a proper diet from your caregiver, if necessary.  Regular physical exercise is one of the most important things you can do for your health. Most adults should get at least 150 minutes of moderate-intensity exercise (any activity that increases your heart rate and causes you to sweat) each week. In addition, most adults need muscle-strengthening exercises on 2 or more days a week.   Maintain a healthy weight. The body mass index (BMI) is a screening tool to identify possible weight problems. It provides an estimate of body fat based on height and weight. Your caregiver can help determine your BMI, and can help you achieve or maintain a healthy weight. For adults 20 years and older:  A BMI below 18.5 is considered underweight.  A BMI of 18.5 to 24.9 is normal.  A BMI of 25 to 29.9 is considered overweight.  A BMI of 30 and above is considered obese.  Maintain normal blood lipids and cholesterol by exercising and minimizing your intake of saturated fat. Eat a balanced diet with plenty of fruits and vegetables. Blood tests for lipids and cholesterol should begin at age 61 and be repeated every 5 years. If your lipid or cholesterol levels are high, you are over 50, or you are a high risk for heart  disease, you may need your cholesterol levels checked more frequently.Ongoing high lipid and cholesterol levels should be treated with medicines if diet and exercise are not effective.  If you smoke, find out from your caregiver how to quit. If you do not use tobacco, do not start.  Lung cancer screening is recommended for adults aged 33 80 years who are at high risk for developing lung cancer because of a history of smoking. Yearly low-dose computed tomography (CT) is recommended for people who have at least a 30-pack-year history of smoking and are a current smoker or have quit within the past 15 years. A pack year of smoking is smoking an average of 1 pack of cigarettes a day for 1 year (for example: 1 pack a day for 30 years or 2 packs a day for 15 years). Yearly screening should continue until the smoker has stopped smoking for at least 15 years. Yearly screening should also be stopped for people who develop a health problem that would prevent them from having lung cancer treatment.  If you are pregnant, do not drink alcohol. If you are breastfeeding, be very cautious about drinking alcohol. If you are not pregnant and choose to drink alcohol, do not exceed 1 drink per day. One drink is considered to be 12 ounces (355 mL) of beer, 5 ounces (148 mL) of wine, or 1.5 ounces (44 mL) of liquor.  Avoid use of street drugs. Do not share needles with anyone. Ask for help if you need support or instructions about stopping  the use of drugs.  High blood pressure causes heart disease and increases the risk of stroke. Blood pressure should be checked at least every 1 to 2 years. Ongoing high blood pressure should be treated with medicines, if weight loss and exercise are not effective.  If you are 59 to 48 years old, ask your caregiver if you should take aspirin to prevent strokes.  Diabetes screening involves taking a blood sample to check your fasting blood sugar level. This should be done once every 3  years, after age 55, if you are within normal weight and without risk factors for diabetes. Testing should be considered at a younger age or be carried out more frequently if you are overweight and have at least 1 risk factor for diabetes.  Breast cancer screening is essential preventative care for women. You should practice "breast self-awareness." This means understanding the normal appearance and feel of your breasts and may include breast self-examination. Any changes detected, no matter how small, should be reported to a caregiver. Women in their 46s and 30s should have a clinical breast exam (CBE) by a caregiver as part of a regular health exam every 1 to 3 years. After age 31, women should have a CBE every year. Starting at age 46, women should consider having a mammogram (breast X-ray) every year. Women who have a family history of breast cancer should talk to their caregiver about genetic screening. Women at a high risk of breast cancer should talk to their caregiver about having an MRI and a mammogram every year.  Breast cancer gene (BRCA)-related cancer risk assessment is recommended for women who have family members with BRCA-related cancers. BRCA-related cancers include breast, ovarian, tubal, and peritoneal cancers. Having family members with these cancers may be associated with an increased risk for harmful changes (mutations) in the breast cancer genes BRCA1 and BRCA2. Results of the assessment will determine the need for genetic counseling and BRCA1 and BRCA2 testing.  The Pap test is a screening test for cervical cancer. Women should have a Pap test starting at age 41. Between ages 58 and 25, Pap tests should be repeated every 2 years. Beginning at age 36, you should have a Pap test every 3 years as long as the past 3 Pap tests have been normal. If you had a hysterectomy for a problem that was not cancer or a condition that could lead to cancer, then you no longer need Pap tests. If you are  between ages 40 and 68, and you have had normal Pap tests going back 10 years, you no longer need Pap tests. If you have had past treatment for cervical cancer or a condition that could lead to cancer, you need Pap tests and screening for cancer for at least 20 years after your treatment. If Pap tests have been discontinued, risk factors (such as a new sexual partner) need to be reassessed to determine if screening should be resumed. Some women have medical problems that increase the chance of getting cervical cancer. In these cases, your caregiver may recommend more frequent screening and Pap tests.  The human papillomavirus (HPV) test is an additional test that may be used for cervical cancer screening. The HPV test looks for the virus that can cause the cell changes on the cervix. The cells collected during the Pap test can be tested for HPV. The HPV test could be used to screen women aged 59 years and older, and should be used in women of any age  who have unclear Pap test results. After the age of 61, women should have HPV testing at the same frequency as a Pap test.  Colorectal cancer can be detected and often prevented. Most routine colorectal cancer screening begins at the age of 76 and continues through age 82. However, your caregiver may recommend screening at an earlier age if you have risk factors for colon cancer. On a yearly basis, your caregiver may provide home test kits to check for hidden blood in the stool. Use of a small camera at the end of a tube, to directly examine the colon (sigmoidoscopy or colonoscopy), can detect the earliest forms of colorectal cancer. Talk to your caregiver about this at age 60, when routine screening begins. Direct examination of the colon should be repeated every 5 to 10 years through age 53, unless early forms of pre-cancerous polyps or small growths are found.  Hepatitis C blood testing is recommended for all people born from [birth year] through 1964 and any  individual with known risks for hepatitis C.  Practice safe sex. Use condoms and avoid high-risk sexual practices to reduce the spread of sexually transmitted infections (STIs). Sexually active women aged 48 and younger should be checked for Chlamydia, which is a common sexually transmitted infection. Older women with new or multiple partners should also be tested for Chlamydia. Testing for other STIs is recommended if you are sexually active and at increased risk.  Osteoporosis is a disease in which the bones lose minerals and strength with aging. This can result in serious bone fractures. The risk of osteoporosis can be identified using a bone density scan. Women ages 47 and over and women at risk for fractures or osteoporosis should discuss screening with their caregivers. Ask your caregiver whether you should be taking a calcium supplement or vitamin D to reduce the rate of osteoporosis.  Menopause can be associated with physical symptoms and risks. Hormone replacement therapy is available to decrease symptoms and risks. You should talk to your caregiver about whether hormone replacement therapy is right for you.  Use sunscreen. Apply sunscreen liberally and repeatedly throughout the day. You should seek shade when your shadow is shorter than you. Protect yourself by wearing long sleeves, pants, a wide-brimmed hat, and sunglasses year round, whenever you are outdoors.  Notify your caregiver of new moles or changes in moles, especially if there is a change in shape or color. Also notify your caregiver if a mole is larger than the size of a pencil eraser.  Stay current with your immunizations. Document Released: 09/26/2010 Document Revised: 07/08/2012 Document Reviewed: 09/26/2010 [facility] Patient Information 2014 [place].

## 2014-07-14 NOTE — Progress Notes (Signed)
Sarah Ramos 1965/11/07 [id number]        49 y.o.  G1P1001 for annual exam.  Several issues noted below.  Past medical history,surgical history, problem list, medications, allergies, family history and social history were all reviewed and documented as reviewed in the EPIC chart.  ROS:  Performed with pertinent positives and negatives included in the history, assessment and plan.   Additional significant findings :  none   Exam: [facility] personassistant Filed Vitals:   07/14/14 0825  BP: 130/84  Height: 5\' 7"  (1.702 m)  Weight: 251 lb (113.853 kg)   General appearance:  Normal affect, orientation and appearance. Skin: Grossly normal HEENT: Without gross lesions.  No cervical or supraclavicular adenopathy. Thyroid normal.  Lungs:  Clear without wheezing, rales or rhonchi Cardiac: RR, without RMG Abdominal:  Soft, nontender, without masses, guarding, rebound, organomegaly or hernia Breasts:  Examined lying and sitting without masses, retractions, discharge or axillary adenopathy. Pelvic:  Ext/BUS/vagina normal  Cervix normal. IUD string visualized  Uterus anteverted, normal size, shape and contour, midline and mobile nontender   Adnexa  Without masses or tenderness    Anus and perineum  Normal   Rectovaginal  Normal sphincter tone without palpated masses or tenderness.    Assessment/Plan:  49 y.o. 421P1001 female for annual exam without menses, Mirena IUD.   1. Mirena IUD 02/2011. Doing well without menses. IUD string visualized. 2. Menopausal symptoms. Patient notes some hot flashes with sweats. Will check baseline TSH FSH. 3. Pap smear/HPV 06/2012 negative. No Pap smear done today.  History of CIN-1 1991 with cryosurgery. CIN-1 2005 with expectant management and negative Pap smears afterwards.  Plan repeat Pap smear at 3-5 year interval. 4. Mammography 01/2014. Continue with annual mammography. SBE monthly reviewed. 5. Health maintenance. Patient requests baseline labs. CBC, comp  rancid mobile panel, lipid profile, urinalysis, TSH and FSH ordered. Follow up in one year, sooner as needed.     Dara LordsFONTAINE,Aramis Zobel P MD, 8:48 AM 07/14/2014

## 2014-07-15 LAB — URINALYSIS W MICROSCOPIC + REFLEX CULTURE
BILIRUBIN URINE: NEGATIVE
Casts: NONE SEEN
Crystals: NONE SEEN
Glucose, UA: NEGATIVE mg/dL
Hgb urine dipstick: NEGATIVE
KETONES UR: NEGATIVE mg/dL
Nitrite: NEGATIVE
Protein, ur: NEGATIVE mg/dL
Specific Gravity, Urine: 1.006 (ref 1.005–1.030)
UROBILINOGEN UA: 0.2 mg/dL (ref 0.0–1.0)
pH: 6.5 (ref 5.0–8.0)

## 2014-07-15 LAB — FOLLICLE STIMULATING HORMONE: FSH: 5.4 m[IU]/mL

## 2014-07-16 LAB — URINE CULTURE: Colony Count: 60000

## 2014-08-13 ENCOUNTER — Other Ambulatory Visit (HOSPITAL_COMMUNITY): Payer: Self-pay | Admitting: *Deleted

## 2014-08-14 ENCOUNTER — Encounter (HOSPITAL_COMMUNITY)
Admission: RE | Admit: 2014-08-14 | Discharge: 2014-08-14 | Disposition: A | Discharge status: Home / Self Care | Payer: BLUE CROSS/BLUE SHIELD | Source: Ambulatory Visit | Attending: Gastroenterology | Admitting: Gastroenterology

## 2014-08-14 DIAGNOSIS — K5190 Ulcerative colitis, unspecified, without complications: Secondary | ICD-10-CM | POA: Diagnosis present

## 2014-08-14 MED ORDER — SODIUM CHLORIDE 0.9 % IV SOLN
INTRAVENOUS | Status: DC
  Administered 2014-08-14: 09:00:00 via INTRAVENOUS

## 2014-08-14 MED ORDER — SODIUM CHLORIDE 0.9 % IV SOLN
5.0000 mg/kg | INTRAVENOUS | Status: DC
  Administered 2014-08-14: 600 mg via INTRAVENOUS
  Filled 2014-08-14: qty 60

## 2014-10-09 ENCOUNTER — Encounter (HOSPITAL_COMMUNITY)
Admission: RE | Admit: 2014-10-09 | Discharge: 2014-10-09 | Disposition: A | Discharge status: Home / Self Care | Payer: BLUE CROSS/BLUE SHIELD | Source: Ambulatory Visit | Attending: Gastroenterology | Admitting: Gastroenterology

## 2014-10-09 DIAGNOSIS — K5190 Ulcerative colitis, unspecified, without complications: Secondary | ICD-10-CM | POA: Diagnosis not present

## 2014-10-09 MED ORDER — SODIUM CHLORIDE 0.9 % IV SOLN: INTRAVENOUS | Status: DC

## 2014-10-09 MED ORDER — SODIUM CHLORIDE 0.9 % IV SOLN
5.0000 mg/kg | INTRAVENOUS | Status: DC
  Administered 2014-10-09: 600 mg via INTRAVENOUS
  Filled 2014-10-09: qty 60

## 2014-12-03 ENCOUNTER — Other Ambulatory Visit (HOSPITAL_COMMUNITY): Payer: Self-pay

## 2014-12-04 ENCOUNTER — Encounter (HOSPITAL_COMMUNITY)
Admission: RE | Admit: 2014-12-04 | Discharge: 2014-12-04 | Disposition: A | Discharge status: Home / Self Care | Payer: BLUE CROSS/BLUE SHIELD | Source: Ambulatory Visit | Attending: Gastroenterology | Admitting: Gastroenterology

## 2014-12-04 DIAGNOSIS — K5190 Ulcerative colitis, unspecified, without complications: Secondary | ICD-10-CM | POA: Diagnosis not present

## 2014-12-04 MED ORDER — SODIUM CHLORIDE 0.9 % IV SOLN
5.0000 mg/kg | INTRAVENOUS | Status: DC
  Administered 2014-12-04: 600 mg via INTRAVENOUS
  Filled 2014-12-04: qty 60

## 2014-12-04 MED ORDER — SODIUM CHLORIDE 0.9 % IV SOLN
Freq: Once | INTRAVENOUS | Status: AC
  Administered 2014-12-04: 09:00:00 via INTRAVENOUS

## 2015-01-29 ENCOUNTER — Encounter (HOSPITAL_COMMUNITY)
Admission: RE | Admit: 2015-01-29 | Discharge: 2015-01-29 | Disposition: A | Discharge status: Home / Self Care | Payer: BLUE CROSS/BLUE SHIELD | Source: Ambulatory Visit | Attending: Gastroenterology | Admitting: Gastroenterology

## 2015-01-29 DIAGNOSIS — K5190 Ulcerative colitis, unspecified, without complications: Secondary | ICD-10-CM | POA: Insufficient documentation

## 2015-01-29 MED ORDER — INFLIXIMAB 100 MG IV SOLR
5.0000 mg/kg | INTRAVENOUS | Status: AC
  Administered 2015-01-29: 600 mg via INTRAVENOUS
  Filled 2015-01-29: qty 60

## 2015-01-29 MED ORDER — SODIUM CHLORIDE 0.9 % IV SOLN
Freq: Once | INTRAVENOUS | Status: AC
  Administered 2015-01-29: 09:00:00 via INTRAVENOUS

## 2015-04-02 ENCOUNTER — Encounter (HOSPITAL_COMMUNITY)
Admission: RE | Admit: 2015-04-02 | Discharge: 2015-04-02 | Disposition: A | Discharge status: Home / Self Care | Payer: BLUE CROSS/BLUE SHIELD | Source: Ambulatory Visit | Attending: Gastroenterology | Admitting: Gastroenterology

## 2015-04-02 DIAGNOSIS — K5190 Ulcerative colitis, unspecified, without complications: Secondary | ICD-10-CM | POA: Diagnosis not present

## 2015-04-02 MED ORDER — SODIUM CHLORIDE 0.9 % IV SOLN
5.0000 mg/kg | INTRAVENOUS | Status: AC
  Administered 2015-04-02: 600 mg via INTRAVENOUS
  Filled 2015-04-02: qty 60

## 2015-04-02 MED ORDER — SODIUM CHLORIDE 0.9 % IV SOLN
Freq: Once | INTRAVENOUS | Status: AC
  Administered 2015-04-02: 09:00:00 via INTRAVENOUS

## 2015-04-28 ENCOUNTER — Other Ambulatory Visit: Payer: Self-pay

## 2015-04-28 DIAGNOSIS — Z1231 Encounter for screening mammogram for malignant neoplasm of breast: Secondary | ICD-10-CM

## 2015-05-17 ENCOUNTER — Ambulatory Visit: Payer: BLUE CROSS/BLUE SHIELD

## 2015-05-20 ENCOUNTER — Ambulatory Visit
Admission: RE | Admit: 2015-05-20 | Discharge: 2015-05-20 | Disposition: A | Discharge status: Home / Self Care | Payer: BLUE CROSS/BLUE SHIELD | Source: Ambulatory Visit

## 2015-05-20 DIAGNOSIS — Z1231 Encounter for screening mammogram for malignant neoplasm of breast: Secondary | ICD-10-CM

## 2015-05-21 ENCOUNTER — Other Ambulatory Visit: Payer: Self-pay | Admitting: Gynecology

## 2015-05-21 DIAGNOSIS — R928 Other abnormal and inconclusive findings on diagnostic imaging of breast: Secondary | ICD-10-CM

## 2015-05-27 ENCOUNTER — Other Ambulatory Visit (HOSPITAL_COMMUNITY): Payer: Self-pay | Admitting: *Deleted

## 2015-05-28 ENCOUNTER — Ambulatory Visit (HOSPITAL_COMMUNITY)
Admission: RE | Admit: 2015-05-28 | Discharge: 2015-05-28 | Disposition: A | Discharge status: Home / Self Care | Payer: BLUE CROSS/BLUE SHIELD | Source: Ambulatory Visit | Attending: Gastroenterology | Admitting: Gastroenterology

## 2015-05-28 DIAGNOSIS — K5190 Ulcerative colitis, unspecified, without complications: Secondary | ICD-10-CM | POA: Diagnosis present

## 2015-05-28 MED ORDER — SODIUM CHLORIDE 0.9 % IV SOLN
5.0000 mg/kg | INTRAVENOUS | Status: AC
  Administered 2015-05-28: 600 mg via INTRAVENOUS
  Filled 2015-05-28: qty 60

## 2015-05-28 MED ORDER — SODIUM CHLORIDE 0.9 % IV SOLN
INTRAVENOUS | Status: DC
  Administered 2015-05-28: 09:00:00 via INTRAVENOUS

## 2015-06-02 ENCOUNTER — Other Ambulatory Visit: Payer: BLUE CROSS/BLUE SHIELD

## 2015-06-03 ENCOUNTER — Other Ambulatory Visit: Payer: Self-pay | Admitting: Gynecology

## 2015-06-03 ENCOUNTER — Ambulatory Visit
Admission: RE | Admit: 2015-06-03 | Discharge: 2015-06-03 | Disposition: A | Discharge status: Home / Self Care | Payer: BLUE CROSS/BLUE SHIELD | Source: Ambulatory Visit | Attending: Gynecology | Admitting: Gynecology

## 2015-06-03 DIAGNOSIS — R928 Other abnormal and inconclusive findings on diagnostic imaging of breast: Secondary | ICD-10-CM

## 2015-07-15 ENCOUNTER — Encounter: Payer: BLUE CROSS/BLUE SHIELD | Admitting: Gynecology

## 2015-07-22 ENCOUNTER — Other Ambulatory Visit (HOSPITAL_COMMUNITY): Payer: Self-pay | Admitting: *Deleted

## 2015-07-23 ENCOUNTER — Ambulatory Visit (HOSPITAL_COMMUNITY): Admission: RE | Admit: 2015-07-23 | Payer: BLUE CROSS/BLUE SHIELD | Source: Ambulatory Visit

## 2015-08-05 ENCOUNTER — Other Ambulatory Visit (HOSPITAL_COMMUNITY): Payer: Self-pay | Admitting: *Deleted

## 2015-08-06 ENCOUNTER — Encounter (HOSPITAL_COMMUNITY)
Admission: RE | Admit: 2015-08-06 | Discharge: 2015-08-06 | Disposition: A | Discharge status: Home / Self Care | Payer: BLUE CROSS/BLUE SHIELD | Source: Ambulatory Visit | Attending: Gastroenterology | Admitting: Gastroenterology

## 2015-08-06 DIAGNOSIS — K5190 Ulcerative colitis, unspecified, without complications: Secondary | ICD-10-CM | POA: Insufficient documentation

## 2015-08-06 MED ORDER — SODIUM CHLORIDE 0.9 % IV SOLN
INTRAVENOUS | Status: AC
  Administered 2015-08-06: 09:00:00 via INTRAVENOUS

## 2015-08-06 MED ORDER — INFLIXIMAB 100 MG IV SOLR
5.0000 mg/kg | Freq: Once | INTRAVENOUS | Status: AC
  Administered 2015-08-06: 600 mg via INTRAVENOUS
  Filled 2015-08-06 (×2): qty 60

## 2015-09-16 ENCOUNTER — Telehealth: Payer: Self-pay | Admitting: Gynecology

## 2015-09-16 ENCOUNTER — Encounter: Payer: Self-pay | Admitting: Gynecology

## 2015-09-16 ENCOUNTER — Ambulatory Visit (INDEPENDENT_AMBULATORY_CARE_PROVIDER_SITE_OTHER): Payer: BLUE CROSS/BLUE SHIELD | Admitting: Gynecology

## 2015-09-16 VITALS — BP 130/86 | Ht 67.5 in | Wt 258.0 lb

## 2015-09-16 DIAGNOSIS — Z01419 Encounter for gynecological examination (general) (routine) without abnormal findings: Secondary | ICD-10-CM

## 2015-09-16 DIAGNOSIS — Z30431 Encounter for routine checking of intrauterine contraceptive device: Secondary | ICD-10-CM | POA: Diagnosis not present

## 2015-09-16 DIAGNOSIS — Z13220 Encounter for screening for lipoid disorders: Secondary | ICD-10-CM

## 2015-09-16 LAB — CBC WITH DIFFERENTIAL/PLATELET
BASOS ABS: 0 {cells}/uL (ref 0–200)
BASOS PCT: 0 %
EOS ABS: 231 {cells}/uL (ref 15–500)
Eosinophils Relative: 3 %
HEMATOCRIT: 40.5 % (ref 35.0–45.0)
Hemoglobin: 13.9 g/dL (ref 11.7–15.5)
LYMPHS PCT: 40 %
Lymphs Abs: 3080 cells/uL (ref 850–3900)
MCH: 32.3 pg (ref 27.0–33.0)
MCHC: 34.3 g/dL (ref 32.0–36.0)
MCV: 94.2 fL (ref 80.0–100.0)
MONO ABS: 462 {cells}/uL (ref 200–950)
MONOS PCT: 6 %
MPV: 10.3 fL (ref 7.5–12.5)
NEUTROS PCT: 51 %
Neutro Abs: 3927 cells/uL (ref 1500–7800)
PLATELETS: 325 10*3/uL (ref 140–400)
RBC: 4.3 MIL/uL (ref 3.80–5.10)
RDW: 13.3 % (ref 11.0–15.0)
WBC: 7.7 10*3/uL (ref 3.8–10.8)

## 2015-09-16 LAB — COMPREHENSIVE METABOLIC PANEL
ALK PHOS: 56 U/L (ref 33–115)
ALT: 23 U/L (ref 6–29)
AST: 20 U/L (ref 10–35)
Albumin: 4.1 g/dL (ref 3.6–5.1)
BUN: 10 mg/dL (ref 7–25)
CALCIUM: 8.8 mg/dL (ref 8.6–10.2)
CHLORIDE: 104 mmol/L (ref 98–110)
CO2: 21 mmol/L (ref 20–31)
Creat: 0.82 mg/dL (ref 0.50–1.10)
GLUCOSE: 93 mg/dL (ref 65–99)
POTASSIUM: 4.2 mmol/L (ref 3.5–5.3)
Sodium: 136 mmol/L (ref 135–146)
Total Bilirubin: 0.6 mg/dL (ref 0.2–1.2)
Total Protein: 6.5 g/dL (ref 6.1–8.1)

## 2015-09-16 LAB — LIPID PANEL
CHOL/HDL RATIO: 4.4 ratio (ref <=5.0)
Cholesterol: 196 mg/dL (ref 125–200)
HDL: 45 mg/dL — AB (ref >=46)
LDL Cholesterol: 125 mg/dL (ref <130)
Triglycerides: 128 mg/dL (ref <150)
VLDL: 26 mg/dL (ref <30)

## 2015-09-16 NOTE — Telephone Encounter (Signed)
09/16/15-[facility] her Mirena for contraception at 100%, no copay. This is a calendar year plan. Appt made with TF for December 2017.wl-Per [email or web address]@[facility]  [id number]

## 2015-09-16 NOTE — Progress Notes (Signed)
    Sarah Ramos [birth year] [id number]        50 y.o.  G1P1001  for annual exam.  Several issues noted below.  Past medical history,surgical history, problem list, medications, allergies, family history and social history were all reviewed and documented as reviewed in the EPIC chart.  ROS:  Performed with pertinent positives and negatives included in the history, assessment and plan.   Additional significant findings :  None   Exam: Sarah Ramos assistant Filed Vitals:   09/16/15 0828  BP: 130/86  Height: 5' 7.5" (1.715 m)  Weight: 258 lb (117.028 kg)   General appearance:  Normal affect, orientation and appearance. Skin: Grossly normal HEENT: Without gross lesions.  No cervical or supraclavicular adenopathy. Thyroid normal.  Lungs:  Clear without wheezing, rales or rhonchi Cardiac: RR, without RMG Abdominal:  Soft, nontender, without masses, guarding, rebound, organomegaly or hernia Breasts:  Examined lying and sitting without masses, retractions, discharge or axillary adenopathy. Pelvic:  Ext/BUS/vagina normal  Cervix normal. IUD string visualized. Pap smear done  Uterus anteverted, normal size, shape and contour, midline and mobile nontender   Adnexa without masses or tenderness    Anus and perineum normal   Rectovaginal normal sphincter tone without palpated masses or tenderness.    Assessment/Plan:  50 y.o. 241P1001 female for annual exam without menses, Mirena IUD.   1. Mirena IUD. Doing well without menses. IUD string visualized. Due to have replaced end of this year and I reminded her to schedule this. 2. Pap smear/HPV 06/2012. Pap smear done today. History of CIN-1 1991 with cryosurgery. CIN-1 2005 followed expectantly with normal Pap smears afterwards. 3. Mammography 05/2015. Continue with annual mammography when due. SBE monthly reviewed. 4. Health maintenance. Baseline CBC, CMP, lipid profile, urinalysis ordered. Follow up 1 year, sooner as  needed.   Sarah Ramos,Sarah Ramos P MD, 8:56 AM 09/16/2015

## 2015-09-16 NOTE — Patient Instructions (Signed)
Follow up the end of this year for replacement of your Mirena IUD.  You may obtain a copy of any labs that were done today by logging onto MyChart as outlined in the instructions provided with your AVS (after visit summary). The office will not call with normal lab results but certainly if there are any significant abnormalities then we will contact you.   Health Maintenance Adopting a healthy lifestyle and getting preventive care can go a long way to promote health and wellness. Talk with your health care provider about what schedule of regular examinations is right for you. This is a good chance for you to check in with your provider about disease prevention and staying healthy. In between checkups, there are plenty of things you can do on your own. Experts have done a lot of research about which lifestyle changes and preventive measures are most likely to keep you healthy. Ask your health care provider for more information. WEIGHT AND DIET  Eat a healthy diet  Be sure to include plenty of vegetables, fruits, low-fat dairy products, and lean protein.  Do not eat a lot of foods high in solid fats, added sugars, or salt.  Get regular exercise. This is one of the most important things you can do for your health.  Most adults should exercise for at least 150 minutes each week. The exercise should increase your heart rate and make you sweat (moderate-intensity exercise).  Most adults should also do strengthening exercises at least twice a week. This is in addition to the moderate-intensity exercise.  Maintain a healthy weight  Body mass index (BMI) is a measurement that can be used to identify possible weight problems. It estimates body fat based on height and weight. Your health care provider can help determine your BMI and help you achieve or maintain a healthy weight.  For females 88 years of age and older:   A BMI below 18.5 is considered underweight.  A BMI of 18.5 to 24.9 is  normal.  A BMI of 25 to 29.9 is considered overweight.  A BMI of 30 and above is considered obese.  Watch levels of cholesterol and blood lipids  You should start having your blood tested for lipids and cholesterol at 49 years of age, then have this test every 5 years.  You may need to have your cholesterol levels checked more often if:  Your lipid or cholesterol levels are high.  You are older than 49 years of age.  You are at high risk for heart disease.  CANCER SCREENING   Lung Cancer  Lung cancer screening is recommended for adults 65-4 years old who are at high risk for lung cancer because of a history of smoking.  A yearly low-dose CT scan of the lungs is recommended for people who:  Currently smoke.  Have quit within the past 15 years.  Have at least a 30-pack-year history of smoking. A pack year is smoking an average of one pack of cigarettes a day for 1 year.  Yearly screening should continue until it has been 15 years since you quit.  Yearly screening should stop if you develop a health problem that would prevent you from having lung cancer treatment.  Breast Cancer  Practice breast self-awareness. This means understanding how your breasts normally appear and feel.  It also means doing regular breast self-exams. Let your health care provider know about any changes, no matter how small.  If you are in your 20s or 30s, you  should have a clinical breast exam (CBE) by a health care provider every 1-3 years as part of a regular health exam.  If you are 72 or older, have a CBE every year. Also consider having a breast X-ray (mammogram) every year.  If you have a family history of breast cancer, talk to your health care provider about genetic screening.  If you are at high risk for breast cancer, talk to your health care provider about having an MRI and a mammogram every year.  Breast cancer gene (BRCA) assessment is recommended for women who have family members  with BRCA-related cancers. BRCA-related cancers include:  Breast.  Ovarian.  Tubal.  Peritoneal cancers.  Results of the assessment will determine the need for genetic counseling and BRCA1 and BRCA2 testing. Cervical Cancer Routine pelvic examinations to screen for cervical cancer are no longer recommended for nonpregnant women who are considered low risk for cancer of the pelvic organs (ovaries, uterus, and vagina) and who do not have symptoms. A pelvic examination may be necessary if you have symptoms including those associated with pelvic infections. Ask your health care provider if a screening pelvic exam is right for you.   The Pap test is the screening test for cervical cancer for women who are considered at risk.  If you had a hysterectomy for a problem that was not cancer or a condition that could lead to cancer, then you no longer need Pap tests.  If you are older than 65 years, and you have had normal Pap tests for the past 10 years, you no longer need to have Pap tests.  If you have had past treatment for cervical cancer or a condition that could lead to cancer, you need Pap tests and screening for cancer for at least 20 years after your treatment.  If you no longer get a Pap test, assess your risk factors if they change (such as having a new sexual partner). This can affect whether you should start being screened again.  Some women have medical problems that increase their chance of getting cervical cancer. If this is the case for you, your health care provider may recommend more frequent screening and Pap tests.  The human papillomavirus (HPV) test is another test that may be used for cervical cancer screening. The HPV test looks for the virus that can cause cell changes in the cervix. The cells collected during the Pap test can be tested for HPV.  The HPV test can be used to screen women 72 years of age and older. Getting tested for HPV can extend the interval between normal  Pap tests from three to five years.  An HPV test also should be used to screen women of any age who have unclear Pap test results.  After 49 years of age, women should have HPV testing as often as Pap tests.  Colorectal Cancer  This type of cancer can be detected and often prevented.  Routine colorectal cancer screening usually begins at 49 years of age and continues through 49 years of age.  Your health care provider may recommend screening at an earlier age if you have risk factors for colon cancer.  Your health care provider may also recommend using home test kits to check for hidden blood in the stool.  A small camera at the end of a tube can be used to examine your colon directly (sigmoidoscopy or colonoscopy). This is done to check for the earliest forms of colorectal cancer.  Routine screening  usually begins at age 31.  Direct examination of the colon should be repeated every 5-10 years through 49 years of age. However, you may need to be screened more often if early forms of precancerous polyps or small growths are found. Skin Cancer  Check your skin from head to toe regularly.  Tell your health care provider about any new moles or changes in moles, especially if there is a change in a mole's shape or color.  Also tell your health care provider if you have a mole that is larger than the size of a pencil eraser.  Always use sunscreen. Apply sunscreen liberally and repeatedly throughout the day.  Protect yourself by wearing long sleeves, pants, a wide-brimmed hat, and sunglasses whenever you are outside. HEART DISEASE, DIABETES, AND HIGH BLOOD PRESSURE   Have your blood pressure checked at least every 1-2 years. High blood pressure causes heart disease and increases the risk of stroke.  If you are between 53 years and 52 years old, ask your health care provider if you should take aspirin to prevent strokes.  Have regular diabetes screenings. This involves taking a blood  sample to check your fasting blood sugar level.  If you are at a normal weight and have a low risk for diabetes, have this test once every three years after 49 years of age.  If you are overweight and have a high risk for diabetes, consider being tested at a younger age or more often. PREVENTING INFECTION  Hepatitis B  If you have a higher risk for hepatitis B, you should be screened for this virus. You are considered at high risk for hepatitis B if:  You were born in a country where hepatitis B is common. Ask your health care provider which countries are considered high risk.  Your parents were born in a high-risk country, and you have not been immunized against hepatitis B (hepatitis B vaccine).  You have HIV or AIDS.  You use needles to inject street drugs.  You live with someone who has hepatitis B.  You have had sex with someone who has hepatitis B.  You get hemodialysis treatment.  You take certain medicines for conditions, including cancer, organ transplantation, and autoimmune conditions. Hepatitis C  Blood testing is recommended for:  Everyone born from [birth year] through 1964.  Anyone with known risk factors for hepatitis C. Sexually transmitted infections (STIs)  You should be screened for sexually transmitted infections (STIs) including gonorrhea and chlamydia if:  You are sexually active and are younger than 49 years of age.  You are older than 49 years of age and your health care provider tells you that you are at risk for this type of infection.  Your sexual activity has changed since you were last screened and you are at an increased risk for chlamydia or gonorrhea. Ask your health care provider if you are at risk.  If you do not have HIV, but are at risk, it may be recommended that you take a prescription medicine daily to prevent HIV infection. This is called pre-exposure prophylaxis (PrEP). You are considered at risk if:  You are sexually active and do not  regularly use condoms or know the HIV status of your partner(s).  You take drugs by injection.  You are sexually active with a partner who has HIV. Talk with your health care provider about whether you are at high risk of being infected with HIV. If you choose to begin PrEP, you should first be tested  for HIV. You should then be tested every 3 months for as long as you are taking PrEP.  PREGNANCY   If you are premenopausal and you may become pregnant, ask your health care provider about preconception counseling.  If you may become pregnant, take 400 to 800 micrograms (mcg) of folic acid every day.  If you want to prevent pregnancy, talk to your health care provider about birth control (contraception). OSTEOPOROSIS AND MENOPAUSE   Osteoporosis is a disease in which the bones lose minerals and strength with aging. This can result in serious bone fractures. Your risk for osteoporosis can be identified using a bone density scan.  If you are 64 years of age or older, or if you are at risk for osteoporosis and fractures, ask your health care provider if you should be screened.  Ask your health care provider whether you should take a calcium or vitamin D supplement to lower your risk for osteoporosis.  Menopause may have certain physical symptoms and risks.  Hormone replacement therapy may reduce some of these symptoms and risks. Talk to your health care provider about whether hormone replacement therapy is right for you.  HOME CARE INSTRUCTIONS   Schedule regular health, dental, and eye exams.  Stay current with your immunizations.   Do not use any tobacco products including cigarettes, chewing tobacco, or electronic cigarettes.  If you are pregnant, do not drink alcohol.  If you are breastfeeding, limit how much and how often you drink alcohol.  Limit alcohol intake to no more than 1 drink per day for nonpregnant women. One drink equals 12 ounces of beer, 5 ounces of wine, or 1  ounces of hard liquor.  Do not use street drugs.  Do not share needles.  Ask your health care provider for help if you need support or information about quitting drugs.  Tell your health care provider if you often feel depressed.  Tell your health care provider if you have ever been abused or do not feel safe at home. Document Released: 09/26/2010 Document Revised: 07/28/2013 Document Reviewed: 02/12/2013 [facility] Patient Information 2015 [place], [place]. This information is not intended to replace advice given to you by your health care provider. Make sure you discuss any questions you have with your health care provider.

## 2015-09-16 NOTE — Addendum Note (Signed)
Addended by: Dayna BarkerGARDNER, Reyhan Moronta K on: 09/16/2015 09:20 AM   Modules accepted: Orders, SmartSet

## 2015-09-17 ENCOUNTER — Other Ambulatory Visit: Payer: Self-pay | Admitting: Gynecology

## 2015-09-17 DIAGNOSIS — E7800 Pure hypercholesterolemia, unspecified: Secondary | ICD-10-CM

## 2015-09-17 LAB — URINALYSIS W MICROSCOPIC + REFLEX CULTURE
BACTERIA UA: NONE SEEN [HPF]
BILIRUBIN URINE: NEGATIVE
CRYSTALS: NONE SEEN [HPF]
Casts: NONE SEEN [LPF]
Glucose, UA: NEGATIVE
KETONES UR: NEGATIVE
Leukocytes, UA: NEGATIVE
Nitrite: NEGATIVE
PROTEIN: NEGATIVE
RBC / HPF: NONE SEEN RBC/HPF (ref <=2)
Specific Gravity, Urine: 1.011 (ref 1.001–1.035)
WBC UA: NONE SEEN WBC/HPF (ref <=5)
Yeast: NONE SEEN [HPF]
pH: 6.5 (ref 5.0–8.0)

## 2015-09-17 LAB — PAP IG W/ RFLX HPV ASCU

## 2015-10-01 ENCOUNTER — Encounter (HOSPITAL_COMMUNITY)
Admission: RE | Admit: 2015-10-01 | Discharge: 2015-10-01 | Disposition: A | Discharge status: Home / Self Care | Payer: BLUE CROSS/BLUE SHIELD | Source: Ambulatory Visit | Attending: Gastroenterology | Admitting: Gastroenterology

## 2015-10-01 DIAGNOSIS — K5190 Ulcerative colitis, unspecified, without complications: Secondary | ICD-10-CM | POA: Insufficient documentation

## 2015-10-01 MED ORDER — SODIUM CHLORIDE 0.9 % IV SOLN
INTRAVENOUS | Status: AC
  Administered 2015-10-01: 10:00:00 via INTRAVENOUS

## 2015-10-01 MED ORDER — SODIUM CHLORIDE 0.9 % IV SOLN
5.0000 mg/kg | Freq: Once | INTRAVENOUS | Status: AC
  Administered 2015-10-01: 600 mg via INTRAVENOUS
  Filled 2015-10-01: qty 60

## 2015-11-17 IMAGING — CT CT CHEST W/ CM
2 of 3 series · 15 of 36 positions shown, 18 images · IV contrast (Omni 300)
Comparison: None.

CLINICAL DATA: Patient with history of ulcerative colitis. Evaluate
for axillary adenopathy.

EXAM:
CT CHEST WITH CONTRAST
TECHNIQUE: Multidetector CT imaging of the chest was performed during
intravenous contrast administration.
CONTRAST:  75mL OMNIPAQUE IOHEXOL 300 MG/ML  SOLN

[Series 2: thorax 5.0 i31f 1 · axial · 0.96mm/px · z∈[+1224,+1569]mm · 12 of 83 slices shown, 15 images]
[im 7/83  mediastinal]
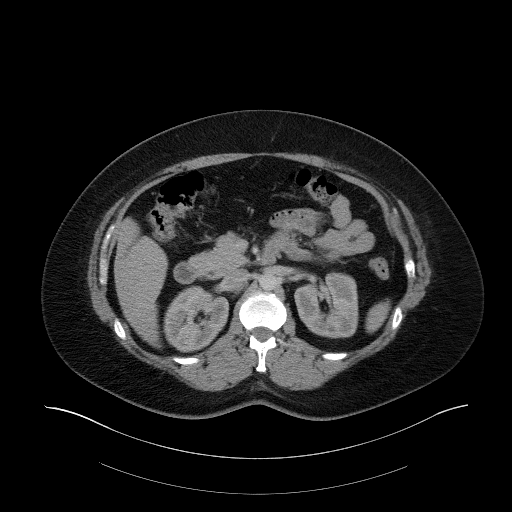
[im 7/83  lung]
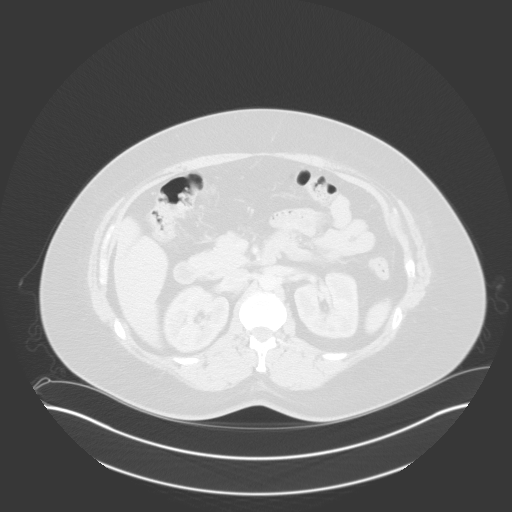
[im 13/83  lung]
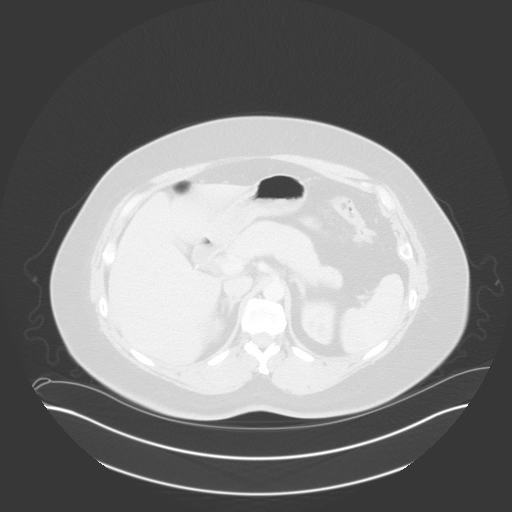
[im 19/83  lung]
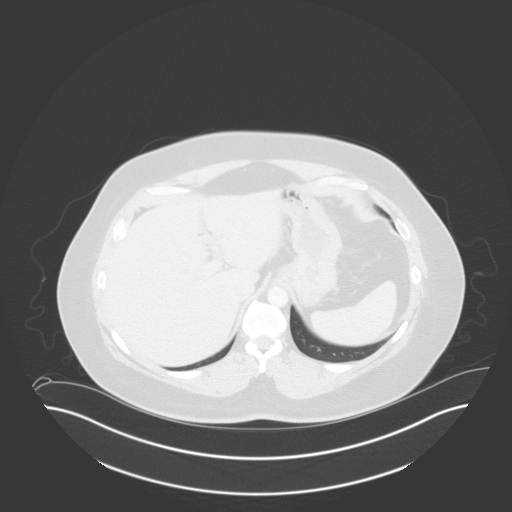
[im 25/83  lung]
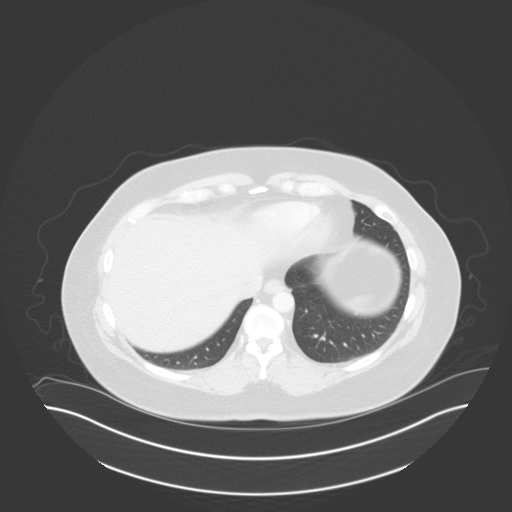
[im 31/83  mediastinal]
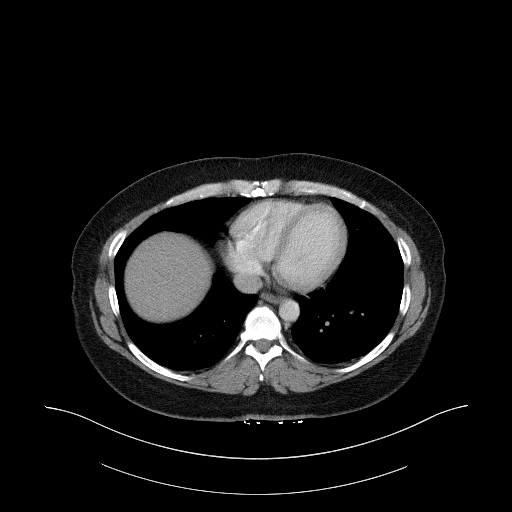
[im 31/83  lung]
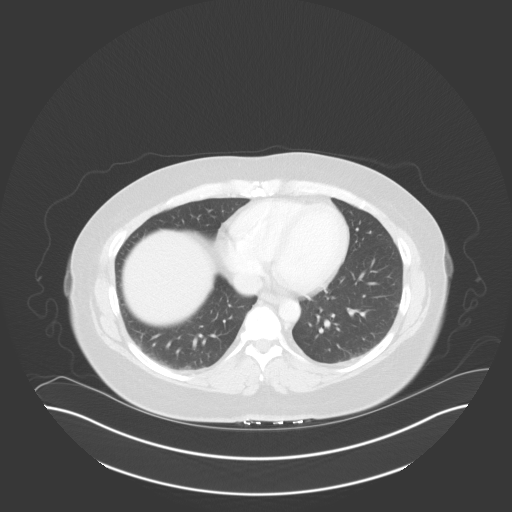
[im 37/83  lung]
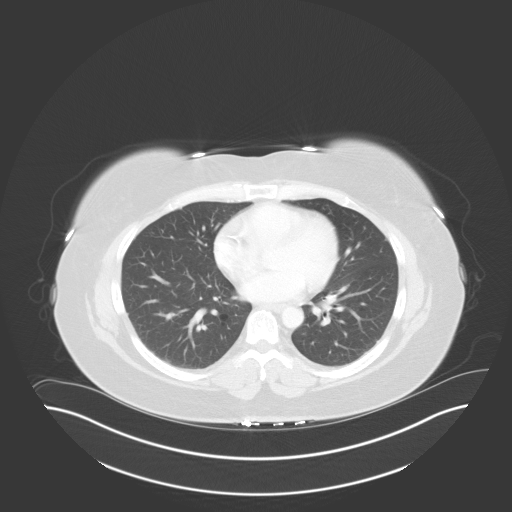
[im 46/83  lung]
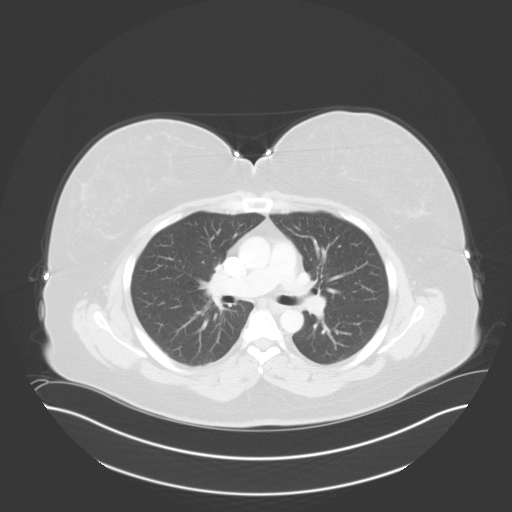
[im 52/83  lung]
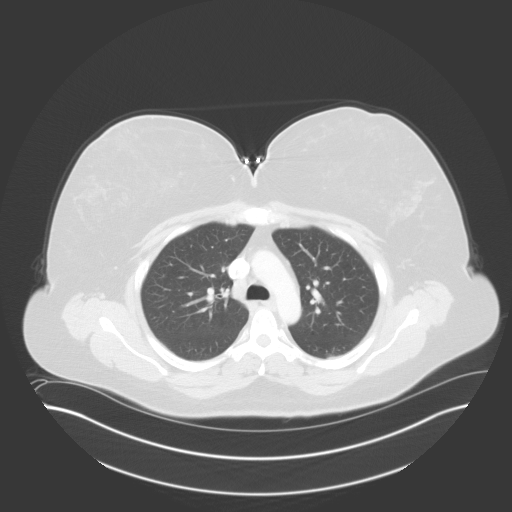
[im 58/83  mediastinal]
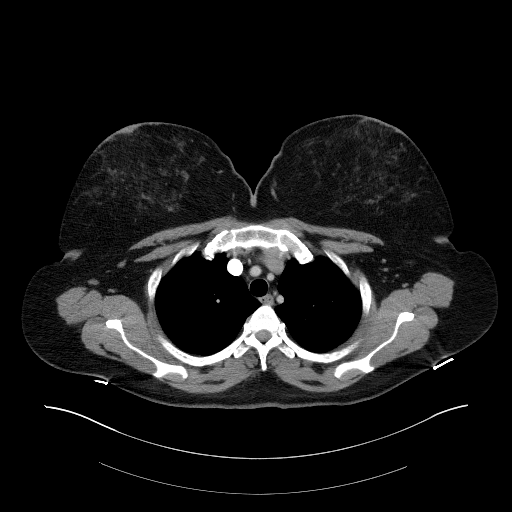
[im 58/83  lung]
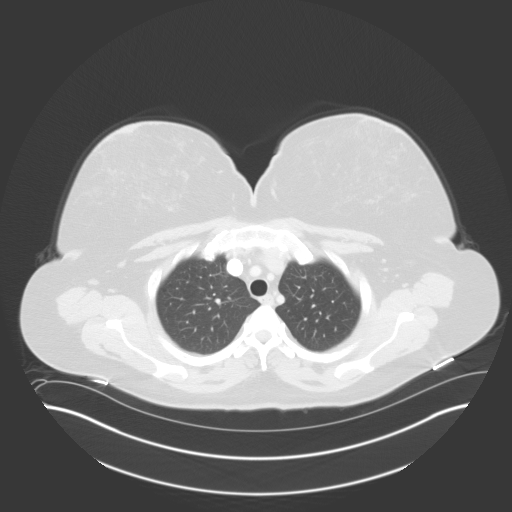
[im 64/83  lung]
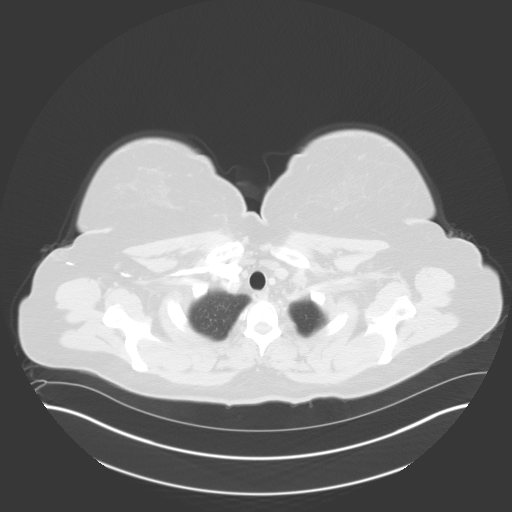
[im 70/83  lung]
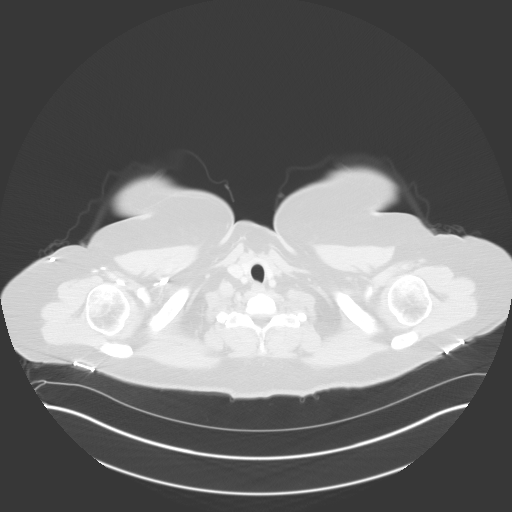
[im 76/83  lung]
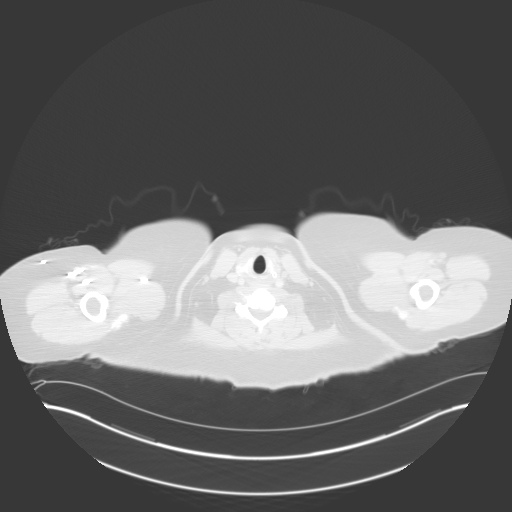

[Series 5: coronal · coronal · 0.81mm/px · 3 of 100 slices shown]
[im 20/100  lung]
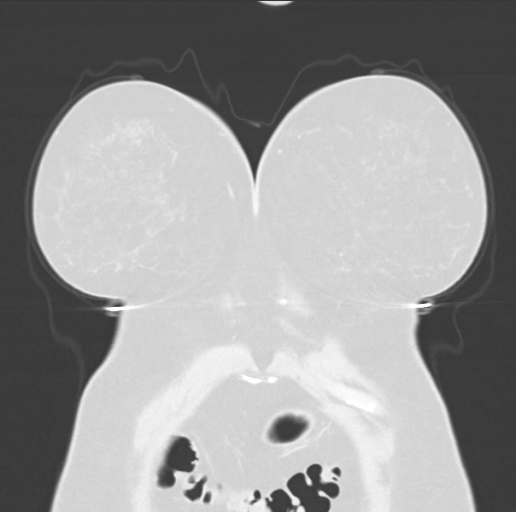
[im 40/100  lung]
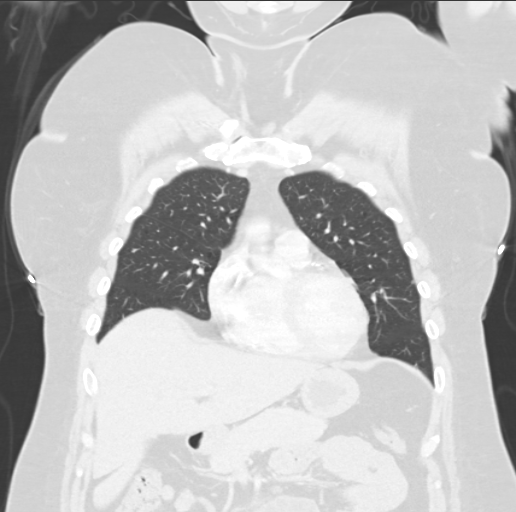
[im 60/100  lung]
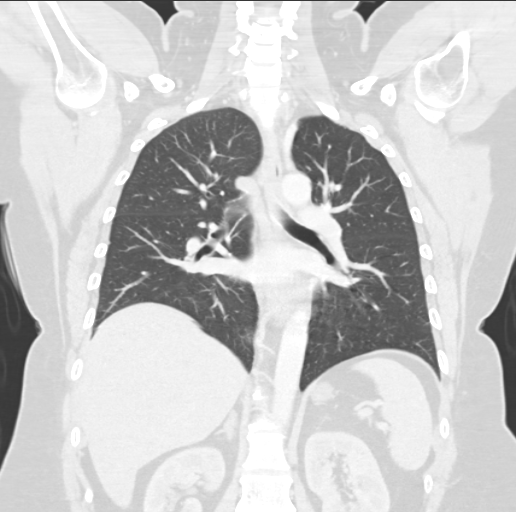

[15 of 36 positions shown; findings below may reference images not displayed]

FINDINGS: Mediastinum/Nodes: Visualized thyroid is unremarkable. No enlarged
axillary, mediastinal or hilar lymphadenopathy. Heart is normal in
size. No pericardial effusion. Coronary arterial vascular
calcifications.

Lungs/Pleura: Central airways are patent. There is a 4 mm subpleural
nodule within the lingula (image 50; series 3). There is a 5 mm
right middle lobe nodule (image 41; series 3). Dependent atelectasis
within right and left lower lobes. No pleural effusion or
pneumothorax.

Upper abdomen: Liver is normal in size and contour without focal
hepatic lesion identified. Post cholecystectomy. No intrahepatic or
extrahepatic biliary ductal dilatation. Visualized pancreas is
unremarkable. Normal bilateral adrenal glands. Kidneys enhance
symmetrically with contrast.

Musculoskeletal: No aggressive or acute appearing osseous lesions.
IMPRESSION: No axillary, mediastinal or hilar lymphadenopathy.

Bilateral pulmonary nodules, the largest of which measures 5 mm. If
the patient is at high risk for bronchogenic carcinoma, follow-up
chest CT at 6-12 months is recommended. If the patient is at low
risk for bronchogenic carcinoma, follow-up chest CT at 12 months is
recommended. This recommendation follows the consensus statement:
Guidelines for Management of Small Pulmonary Nodules Detected on CT
Scans: A Statement from the [HOSPITAL] as published in

## 2015-11-25 ENCOUNTER — Other Ambulatory Visit (HOSPITAL_COMMUNITY): Payer: Self-pay | Admitting: *Deleted

## 2015-11-26 ENCOUNTER — Encounter (HOSPITAL_COMMUNITY)
Admission: RE | Admit: 2015-11-26 | Discharge: 2015-11-26 | Disposition: A | Discharge status: Home / Self Care | Payer: BLUE CROSS/BLUE SHIELD | Source: Ambulatory Visit | Attending: Gastroenterology | Admitting: Gastroenterology

## 2015-11-26 DIAGNOSIS — K5190 Ulcerative colitis, unspecified, without complications: Secondary | ICD-10-CM | POA: Insufficient documentation

## 2015-11-26 MED ORDER — SODIUM CHLORIDE 0.9 % IV SOLN
5.0000 mg/kg | Freq: Once | INTRAVENOUS | Status: AC
  Administered 2015-11-26: 600 mg via INTRAVENOUS
  Filled 2015-11-26: qty 60

## 2015-11-26 MED ORDER — SODIUM CHLORIDE 0.9 % IV SOLN
INTRAVENOUS | Status: AC
  Administered 2015-11-26: 10:00:00 via INTRAVENOUS

## 2016-01-20 ENCOUNTER — Other Ambulatory Visit (HOSPITAL_COMMUNITY): Payer: Self-pay | Admitting: *Deleted

## 2016-01-20 ENCOUNTER — Encounter: Payer: Self-pay | Admitting: Rheumatology

## 2016-01-20 ENCOUNTER — Ambulatory Visit (INDEPENDENT_AMBULATORY_CARE_PROVIDER_SITE_OTHER): Payer: BLUE CROSS/BLUE SHIELD | Admitting: Rheumatology

## 2016-01-20 VITALS — BP 128/90 | HR 91 | Resp 13 | Ht 67.5 in | Wt 261.0 lb

## 2016-01-20 DIAGNOSIS — K529 Noninfective gastroenteritis and colitis, unspecified: Secondary | ICD-10-CM

## 2016-01-20 DIAGNOSIS — M62838 Other muscle spasm: Secondary | ICD-10-CM | POA: Diagnosis not present

## 2016-01-20 DIAGNOSIS — M797 Fibromyalgia: Secondary | ICD-10-CM | POA: Diagnosis not present

## 2016-01-20 DIAGNOSIS — G4700 Insomnia, unspecified: Secondary | ICD-10-CM | POA: Diagnosis not present

## 2016-01-20 DIAGNOSIS — R5383 Other fatigue: Secondary | ICD-10-CM | POA: Diagnosis not present

## 2016-01-20 MED ORDER — LIDOCAINE HCL 1 % IJ SOLN
0.5000 mL | INTRAMUSCULAR | Status: AC | PRN
  Administered 2016-01-20: .5 mL

## 2016-01-20 NOTE — Progress Notes (Signed)
*IMAGE* Office Visit Note  Patient: Sarah Ramos             Date of Birth: 23-May-1965           MRN: [id number]             PCP: Lolita Patella, MD Referring: Elias Else, MD Visit Date: 01/20/2016    Subjective:  Follow-up on fibromyalgia syndrome fatigue and insomnia.  History of Present Illness: Sarah Ramos is a 50 y.o. female seen in our office 10/25/2015.  On that visit she was having a lot of pain in her trapezius muscles. I gave her injection of 10 mg of Kenalog mixed with 0.3 and also 1% lidocaine without epinephrine. By 2-3 days after the injection she was ordered a 50-75% better. She also carries an additional diagnosis of bulging disc in her neck for which she is seeing Dr. Venetia Maxon. An MRI was recently ordered and she will discuss the results of the new MRI with Dr. Venetia Maxon in the next few weeks.  Currently her fibromyalgia is rated about 5-6 on a scale of 0-10 with fatigue rated 5-6. She is doing well with her current medications.  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 Though baclofen does not make her sleepy in general she tries to avoid baclofen.     Activities of Daily Living:  Patient reports morning stiffness for 15 minute.   Patient Reports nocturnal pain.  Difficulty dressing/grooming: Denies Difficulty climbing stairs: Denies Difficulty getting out of chair: Denies Difficulty using hands for taps, buttons, cutlery, and/or writing: Denies   Review of Systems  Constitutional: Positive for fatigue.  HENT: Negative for mouth sores and mouth dryness.   Eyes: Negative for dryness.  Respiratory: Negative for shortness of breath.   Gastrointestinal: Negative for constipation and diarrhea.  Musculoskeletal: Positive for myalgias and myalgias.  Skin: Negative for sensitivity to sunlight.  Psychiatric/Behavioral: Positive for sleep disturbance. Negative for  decreased concentration.    PMFS History:  Patient Active Problem List   Diagnosis Date Noted  . Fatigue 01/20/2016  . Insomnia 01/20/2016  . Trapezius muscle spasm 01/20/2016  . Fibromyalgia 07/03/2012  . Colitis     Past Medical History:  Diagnosis Date  . Chronic fatigue   . CIN I (cervical intraepithelial neoplasia I) 2005  . Colitis   . Fibromyalgia   . Skin rash     Family History  Problem Relation Age of Onset  . Breast cancer Mother 75  . Cancer Mother     Bone cancer  . Heart disease Paternal Grandmother   . Heart disease Paternal Grandfather    Past Surgical History:  Procedure Laterality Date  . CHOLECYSTECTOMY    . GYNECOLOGIC CRYOSURGERY  1991   CIN 1  . Mirena     Inserted 01-2006, 02/2011   Social History   Social History Narrative  . No narrative on file     Objective: Vital Signs: BP 128/90 (BP Location: Left Wrist, Patient Position: Sitting, Cuff Size: Large)   Pulse 91   Resp 13   Ht 5' 7.5" (1.715 m)   Wt 261 lb (118.4 kg)   BMI 40.28 kg/m    Physical Exam  Constitutional: She is oriented to person, place, and time. She appears well-developed and well-nourished.  HENT:  Head: Normocephalic and atraumatic.  Eyes: EOM are normal. Pupils are equal, round, and reactive to light.  Cardiovascular: Normal rate, regular rhythm and normal heart sounds.  Exam reveals no gallop and no friction rub.   No murmur heard. Pulmonary/Chest: Effort normal and breath sounds normal. She has no wheezes. She has no rales.  Abdominal: Soft. Bowel sounds are normal. She exhibits no distension. There is no tenderness. There is no guarding. No hernia.  Musculoskeletal: Normal range of motion. She exhibits no edema, tenderness or deformity.  Lymphadenopathy:    She has no cervical adenopathy.  Neurological: She is alert and oriented to person, place, and time. Coordination normal.  Skin: Skin is warm and dry. Capillary refill takes less than 2 seconds. No rash  noted.  Psychiatric: She has a normal mood and affect. Her behavior is normal.     Musculoskeletal Exam:  Full range of motion of all joints. Fibromyalgia tender points are 18 out of 18 positive. Grip strength is equal strength bilaterally.  CDAI Exam: CDAI Homunculus Exam:   Joint Counts:  CDAI Tender Joint count: 0 CDAI Swollen Joint count: 0     Investigation: No additional findings. Labs that include CMP with GFR from Dr. Haywood PaoHung's office from 06/28/2015 is normal. Except for nonfasting glucose is 116. Ativan is 0.80 normal. GFR is 87 and normal.  Imaging: No results found.  Speciality Comments: No specialty comments available.    Procedures:  Trigger Point Inj Date/Time: 01/20/2016 4:29 PM Performed by: Sarah Ramos, Rasheena Talmadge Authorized by: Sarah Ramos, Connie Lasater   Consent Given by:  Patient Site marked: the procedure site was marked   Timeout: prior to procedure the correct patient, procedure, and site was verified   Indications:  Muscle spasm and pain Total # of Trigger Points:  2 Location: neck   Needle Size:  27 G Approach:  Dorsal Medications #1:  0.5 mL lidocaine 1 % Medications #2:  0.5 mL lidocaine 1 % Patient tolerance:  Patient tolerated the procedure well with no immediate complications Comments: Patient was 50% improved with the cortisone plus lidocaine injection given in July 2017 visit. Today I am going to give her lidocaine only injection to each trapezius muscle.    Allergies: Codeine; Tetracyclines & related; and Topamax   Assessment / Plan: Visit Diagnoses:  Fibromyalgia Patient is stable with her fibromyalgia. She rates her discomfort as a 5-6 on a scale of 0-10. Active disease with generalized pain and 18 out of 18 tender points.   Fatigue, unspecified type Ongoing fatigue. Her discomfort is about 6-7 on a scale of 0-10.   Insomnia, unspecified type Patient uses Flexeril when needed at night for help with sleep. She uses it sparingly. Works well  for her.  Trapezius muscle spasm  Today she is having moderate amount of pain to the bilateral trapezius area. Therefore I gave her 0.5 mL's of 1% lidocaine without epinephrine in each trapezius muscle. Patient tolerated procedure well. There no complications. Cortisone injection was not given.  She can come back in 3 months and we can repeat lidocaine only injection if it is helpful for her.  For the colitis, she is getting Remicade every 8 weeks. It is prescribed by Dr. Elnoria HowardHung, gastroenterologist  10mg  kenalog and 0.863ml of 1% Lidocaine  To each trapezius on July 2017 visit ; pt reports that it improved 50% in 2-4 days; history of bulging disc being addressed by dr Venetia Maxonstern   She will use baclofen sparingly. (Note: Have tried Robaxin in the past for the patient was she feels drowsy with this medication so we will avoid it. It is  important to know that she is not allergic to this medication. To her drowsiness that were trying to avoid this medication for her.)  Patient will call us if she needs any medications refilled.  She does have FMLA from Dr. Haywood Pao office which allows her to take 1 or 2 days off per month if needed.   No problem-specific Assessment & Plan notes found for this encounter.    Follow-Up Instructions: No Follow-up on file.  Orders: No orders of the defined types were placed in this encounter.  Meds ordered this encounter  Medications  . aspirin EC 81 MG tablet    Sig: Take 81 mg by mouth daily.  . baclofen (LIORESAL) 10 MG tablet    Sig: Take 10 mg by mouth 2 (two) times daily as needed for muscle spasms.

## 2016-01-21 ENCOUNTER — Encounter (HOSPITAL_COMMUNITY)
Admission: RE | Admit: 2016-01-21 | Discharge: 2016-01-21 | Disposition: A | Discharge status: Home / Self Care | Payer: BLUE CROSS/BLUE SHIELD | Source: Ambulatory Visit | Attending: Gastroenterology | Admitting: Gastroenterology

## 2016-01-21 DIAGNOSIS — K5190 Ulcerative colitis, unspecified, without complications: Secondary | ICD-10-CM | POA: Diagnosis present

## 2016-01-21 MED ORDER — SODIUM CHLORIDE 0.9 % IV SOLN
Freq: Once | INTRAVENOUS | Status: AC
  Administered 2016-01-21: 09:00:00 via INTRAVENOUS

## 2016-01-21 MED ORDER — SODIUM CHLORIDE 0.9 % IV SOLN
5.0000 mg/kg | Freq: Once | INTRAVENOUS | Status: AC
  Administered 2016-01-21: 600 mg via INTRAVENOUS
  Filled 2016-01-21: qty 60

## 2016-03-03 ENCOUNTER — Ambulatory Visit: Payer: BLUE CROSS/BLUE SHIELD | Admitting: Gynecology

## 2016-03-10 ENCOUNTER — Ambulatory Visit: Payer: BLUE CROSS/BLUE SHIELD | Admitting: Gynecology

## 2016-03-17 ENCOUNTER — Encounter (HOSPITAL_COMMUNITY): Payer: BLUE CROSS/BLUE SHIELD

## 2016-03-17 ENCOUNTER — Other Ambulatory Visit: Payer: Self-pay | Admitting: Rheumatology

## 2016-03-17 NOTE — Telephone Encounter (Signed)
ok 

## 2016-03-17 NOTE — Telephone Encounter (Signed)
Last Visit: 01/20/16 Next Visit: 07/18/16  Okay to refill Folgard?

## 2016-05-05 ENCOUNTER — Encounter: Payer: Self-pay | Admitting: Gynecology

## 2016-05-05 ENCOUNTER — Ambulatory Visit (INDEPENDENT_AMBULATORY_CARE_PROVIDER_SITE_OTHER): Payer: BLUE CROSS/BLUE SHIELD | Admitting: Gynecology

## 2016-05-05 VITALS — BP 122/80

## 2016-05-05 DIAGNOSIS — Z30433 Encounter for removal and reinsertion of intrauterine contraceptive device: Secondary | ICD-10-CM

## 2016-05-05 NOTE — Patient Instructions (Signed)
Intrauterine Device Insertion Most often, an intrauterine device (IUD) is inserted into the uterus to prevent pregnancy. There are 2 types of IUDs available:  Copper IUD-This type of IUD creates an environment that is not favorable to sperm survival. The mechanism of action of the copper IUD is not known for certain. It can stay in place for 10 years.  Hormone IUD-This type of IUD contains the hormone progestin (synthetic progesterone). The progestin thickens the cervical mucus and prevents sperm from entering the uterus, and it also thins the uterine lining. There is no evidence that the hormone IUD prevents implantation. One hormone IUD can stay in place for up to 5 years, and a different hormone IUD can stay in place for up to 3 years. An IUD is the most cost-effective birth control if left in place for the full duration. It may be removed at any time. LET YOUR HEALTH CARE PROVIDER KNOW ABOUT:  Any allergies you have.  All medicines you are taking, including vitamins, herbs, eye drops, creams, and over-the-counter medicines.  Previous problems you or members of your family have had with the use of anesthetics.  Any blood disorders you have.  Previous surgeries you have had.  Possibility of pregnancy.  Medical conditions you have. RISKS AND COMPLICATIONS  Generally, intrauterine device insertion is a safe procedure. However, as with any procedure, complications can occur. Possible complications include:  Accidental puncture (perforation) of the uterus.  Accidental placement of the IUD either in the muscle layer of the uterus (myometrium) or outside the uterus. If this happens, the IUD can be found essentially floating around the bowels and must be taken out surgically.  The IUD may fall out of the uterus (expulsion). This is more common in women who have recently had a child.   Pregnancy in the fallopian tube (ectopic).  Pelvic inflammatory disease (PID), which is infection of  the uterus and fallopian tubes. The risk of PID is slightly increased in the first 20 days after the IUD is placed, but the overall risk is still very low. BEFORE THE PROCEDURE  Schedule the IUD insertion for when you will have your menstrual period or right after, to make sure you are not pregnant. Placement of the IUD is better tolerated shortly after a menstrual cycle.  You may need to take tests or be examined to make sure you are not pregnant.  You may be required to take a pregnancy test.  You may be required to get checked for sexually transmitted infections (STIs) prior to placement. Placing an IUD in someone who has an infection can make the infection worse.  You may be given a pain reliever to take 1 or 2 hours before the procedure.  An exam will be performed to determine the size and position of your uterus.  Ask your health care provider about changing or stopping your regular medicines. PROCEDURE   A tool (speculum) is placed in the vagina. This allows your health care provider to see the lower part of the uterus (cervix).  The cervix is prepped with a medicine that lowers the risk of infection.  You may be given a medicine to numb each side of the cervix (intracervical or paracervical block). This is used to block and control any discomfort with insertion.  A tool (uterine sound) is inserted into the uterus to determine the length of the uterine cavity and the direction the uterus may be tilted.  A slim instrument (IUD inserter) is inserted through the cervical   canal and into your uterus.  The IUD is placed in the uterine cavity and the insertion device is removed.  The nylon string that is attached to the IUD and used for eventual IUD removal is trimmed. It is trimmed so that it lays high in the vagina, just outside the cervix. AFTER THE PROCEDURE  You may have bleeding after the procedure. This is normal. It varies from light spotting for a few days to menstrual-like  bleeding.  You may have mild cramping. This information is not intended to replace advice given to you by your health care provider. Make sure you discuss any questions you have with your health care provider. Document Released: 11/09/2010 Document Revised: 01/01/2013 Document Reviewed: 09/01/2012 Elsevier Interactive Patient Education  2017 Elsevier Inc.  

## 2016-05-05 NOTE — Progress Notes (Signed)
    Sarah Ramos 03/23/1966 [id number]        51 y.o.  G1P1001  presents for Mirena IUD replacement. She has read through the booklet, has no contraindications and signed the consent form.  I reviewed the removal and insertional process with her as well as the risks to include infection, either immediate or long-term, uterine perforation or migration requiring surgery to remove, other complications such as pain, hormonal side effects and possibility of failure with subsequent pregnancy.   Exam with Sarah Ramos assistant Vitals:   05/05/16 1551  BP: 122/80    Pelvic: External BUS vagina normal. Cervix normal with IUD string visualized. Uterus anteverted normal size shape contour midline mobile nontender. Adnexa without masses or tenderness.  Procedure: The cervix was visualized with a speculum and the old Mirena IUD string was grasped with the Sarah Ramos, removed, shown to the patient and discarded. The cervix was then cleansed with Betadine, anterior lip grasped with a single-tooth tenaculum, the uterus was attempted to be sounded but could not negotiate the cervical canal due to stenosis. The cervical canal was then gently dilated with a cervical dilator and subsequently a Mirena IUD was placed according to manufacturer's recommendations without difficulty. The strings were trimmed. The patient tolerated well and will follow up in one month for a postinsertional check.  Lot number:  Sarah Ramos    Ahtziri Jeffries P MD, 4:25 PM 05/05/2016

## 2016-06-09 ENCOUNTER — Ambulatory Visit (INDEPENDENT_AMBULATORY_CARE_PROVIDER_SITE_OTHER): Payer: BLUE CROSS/BLUE SHIELD | Admitting: Gynecology

## 2016-06-09 ENCOUNTER — Encounter: Payer: Self-pay | Admitting: Gynecology

## 2016-06-09 VITALS — BP 132/86

## 2016-06-09 DIAGNOSIS — Z30431 Encounter for routine checking of intrauterine contraceptive device: Secondary | ICD-10-CM | POA: Diagnosis not present

## 2016-06-09 NOTE — Progress Notes (Signed)
    Sarah Ramos Neider [birth year] [id number]        51 y.o.  G1P1001 presents having had her Mirena IUD replaced 05/05/2016. Has done well without complaints.  Past medical history,surgical history, problem list, medications, allergies, family history and social history were all reviewed and documented in the EPIC chart.  Directed ROS with pertinent positives and negatives documented in the history of present illness/assessment and plan.  Exam: Biomedical scientistBlanca assistant Vitals:   06/09/16 1529  BP: 132/86   General appearance:  Normal Abdomen soft nontender without masses guarding rebound Pelvic external BUS vagina normal. Cervix normal. IUD string visualized in appropriate length. Uterus normal size midline mobile nontender. Adnexa without masses or tenderness  Assessment/Plan:  51 y.o. G1P1001 with normal IUD follow up exam. 2 for annual exam this coming June/July. Will make an appointment for this.    Dara LordsFONTAINE,Juron Vorhees P MD, 3:51 PM 06/09/2016

## 2016-06-09 NOTE — Patient Instructions (Addendum)
Follow up in June/July for annual exam when due

## 2016-07-17 NOTE — Progress Notes (Deleted)
Office Visit Note  Patient: Sarah Ramos             Date of Birth: Sep 25, 1965           MRN: [id number]             PCP: Lolita Patella, MD Referring: Elias Else, MD Visit Date: 07/18/2016 Occupation: @    Subjective:  No chief complaint on file.   History of Present Illness: LOUNELL Ramos is a 51 y.o. female ***   Activities of Daily Living:  Patient reports morning stiffness for *** {minute/hour:19697}.   Patient {ACTIONS;DENIES/REPORTS:21021675::"Denies"} nocturnal pain.  Difficulty dressing/grooming: {ACTIONS;DENIES/REPORTS:21021675::"Denies"} Difficulty climbing stairs: {ACTIONS;DENIES/REPORTS:21021675::"Denies"} Difficulty getting out of chair: {ACTIONS;DENIES/REPORTS:21021675::"Denies"} Difficulty using hands for taps, buttons, cutlery, and/or writing: {ACTIONS;DENIES/REPORTS:21021675::"Denies"}   No Rheumatology ROS completed.   PMFS History:  Patient Active Problem List   Diagnosis Date Noted  . Fatigue 01/20/2016  . Insomnia 01/20/2016  . Trapezius muscle spasm 01/20/2016  . Fibromyalgia 07/03/2012  . Colitis     Past Medical History:  Diagnosis Date  . Chronic fatigue   . CIN I (cervical intraepithelial neoplasia I) 2005  . Colitis   . Fibromyalgia   . Skin rash     Family History  Problem Relation Age of Onset  . Breast cancer Mother 54  . Cancer Mother     Bone cancer  . Heart disease Paternal Grandmother   . Heart disease Paternal Grandfather    Past Surgical History:  Procedure Laterality Date  . CHOLECYSTECTOMY    . GYNECOLOGIC CRYOSURGERY  1991   CIN 1  . INTRAUTERINE DEVICE INSERTION  01-2006, 02/2011, 05/05/2016   Mirena   Social History   Social History Narrative  . No narrative on file     Objective: Vital Signs: There were no vitals taken for this visit.   Physical Exam   Musculoskeletal Exam: ***  CDAI Exam: No CDAI exam completed.    Investigation: No additional findings. CBC      Component Value Date/Time   WBC 7.7 09/16/2015 0900   RBC 4.30 09/16/2015 0900   HGB 13.9 09/16/2015 0900   HCT 40.5 09/16/2015 0900   PLT 325 09/16/2015 0900   MCV 94.2 09/16/2015 0900   MCH 32.3 09/16/2015 0900   MCHC 34.3 09/16/2015 0900   RDW 13.3 09/16/2015 0900   LYMPHSABS 3,080 09/16/2015 0900   MONOABS 462 09/16/2015 0900   EOSABS 231 09/16/2015 0900   BASOSABS 0 09/16/2015 0900   CMP     Component Value Date/Time   NA 136 09/16/2015 0900   K 4.2 09/16/2015 0900   CL 104 09/16/2015 0900   CO2 21 09/16/2015 0900   GLUCOSE 93 09/16/2015 0900   BUN 10 09/16/2015 0900   CREATININE 0.82 09/16/2015 0900   CALCIUM 8.8 09/16/2015 0900   PROT 6.5 09/16/2015 0900   ALBUMIN 4.1 09/16/2015 0900   AST 20 09/16/2015 0900   ALT 23 09/16/2015 0900   ALKPHOS 56 09/16/2015 0900   BILITOT 0.6 09/16/2015 0900   Imaging: No results found.  Speciality Comments: No specialty comments available.    Procedures:  No procedures performed Allergies: Codeine; Tetracyclines & related; and Topamax   Assessment / Plan:     Visit Diagnoses: Fibromyalgia  Fatigue, unspecified type  Insomnia, unspecified type  Colitis - Remicade q 8 weeks per Dr Elnoria Howard.     Orders: No orders of the defined types were placed in this encounter.  No orders of the defined  types were placed in this encounter.   Face-to-face time spent with patient was *** minutes. 50% of time was spent in counseling and coordination of care.  Follow-Up Instructions: No Follow-up on fiToniann Fail.   Valetta Mulroy, RT  Note - This record has been created using [employer].  Chart creation errors have been sought, but Ronelle Smallman not always  have been located. Such creation errors do not reflect on  the standard of medical care.

## 2016-07-18 ENCOUNTER — Ambulatory Visit: Payer: BLUE CROSS/BLUE SHIELD | Admitting: Rheumatology

## 2016-07-18 ENCOUNTER — Encounter: Payer: Self-pay | Admitting: Rheumatology

## 2016-07-18 ENCOUNTER — Ambulatory Visit (INDEPENDENT_AMBULATORY_CARE_PROVIDER_SITE_OTHER): Payer: BLUE CROSS/BLUE SHIELD | Admitting: Rheumatology

## 2016-07-18 VITALS — BP 122/80 | HR 78 | Resp 16 | Ht 67.5 in | Wt 250.0 lb

## 2016-07-18 DIAGNOSIS — F5101 Primary insomnia: Secondary | ICD-10-CM | POA: Diagnosis not present

## 2016-07-18 DIAGNOSIS — M797 Fibromyalgia: Secondary | ICD-10-CM

## 2016-07-18 DIAGNOSIS — R634 Abnormal weight loss: Secondary | ICD-10-CM

## 2016-07-18 DIAGNOSIS — R5383 Other fatigue: Secondary | ICD-10-CM | POA: Diagnosis not present

## 2016-07-18 DIAGNOSIS — M62838 Other muscle spasm: Secondary | ICD-10-CM | POA: Diagnosis not present

## 2016-07-18 LAB — COMPLETE METABOLIC PANEL WITH GFR
AG Ratio: 1.6 Ratio (ref 1.0–2.5)
ALT: 23 U/L (ref 6–29)
AST: 23 U/L (ref 10–35)
Albumin: 4.3 g/dL (ref 3.6–5.1)
Alkaline Phosphatase: 68 U/L (ref 33–115)
BUN/Creatinine Ratio: 12 Ratio (ref 6–22)
BUN: 10 mg/dL (ref 7–25)
CALCIUM: 9.6 mg/dL (ref 8.6–10.2)
CHLORIDE: 101 mmol/L (ref 98–110)
CO2: 23 mmol/L (ref 20–31)
CREATININE: 0.83 mg/dL (ref 0.50–1.10)
GFR, Est African American: >89 mL/min (ref >=60)
GFR, Est Non African American: 83 mL/min (ref >=60)
GLOBULIN: 2.7 g/dL (ref 1.9–3.7)
GLUCOSE: 80 mg/dL (ref 65–99)
Potassium: 4.6 mmol/L (ref 3.5–5.3)
Sodium: 136 mmol/L (ref 135–146)
TOTAL PROTEIN: 7 g/dL (ref 6.1–8.1)
Total Bilirubin: 0.4 mg/dL (ref 0.2–1.2)

## 2016-07-18 LAB — CBC WITH DIFFERENTIAL/PLATELET
BASOS PCT: 0 %
Basophils Absolute: 0 cells/uL (ref 0–200)
EOS ABS: 101 {cells}/uL (ref 15–500)
Eosinophils Relative: 1 %
HEMATOCRIT: 43.3 % (ref 35.0–45.0)
HEMOGLOBIN: 14.6 g/dL (ref 11.7–15.5)
LYMPHS ABS: 4242 {cells}/uL — AB (ref 850–3900)
Lymphocytes Relative: 42 %
MCH: 31.5 pg (ref 27.0–33.0)
MCHC: 33.7 g/dL (ref 32.0–36.0)
MCV: 93.5 fL (ref 80.0–100.0)
MONO ABS: 707 {cells}/uL (ref 200–950)
MPV: 10.5 fL (ref 7.5–12.5)
Monocytes Relative: 7 %
NEUTROS ABS: 5050 {cells}/uL (ref 1500–7800)
Neutrophils Relative %: 50 %
Platelets: 341 10*3/uL (ref 140–400)
RBC: 4.63 MIL/uL (ref 3.80–5.10)
RDW: 13.2 % (ref 11.0–15.0)
WBC: 10.1 10*3/uL (ref 3.8–10.8)

## 2016-07-18 MED ORDER — BACLOFEN 10 MG PO TABS: 10.0000 mg | ORAL_TABLET | Freq: Two times a day (BID) | ORAL | 1 refills | Status: DC

## 2016-07-18 MED ORDER — CYCLOBENZAPRINE HCL 10 MG PO TABS: 10.0000 mg | ORAL_TABLET | Freq: Every day | ORAL | 1 refills | Status: DC

## 2016-07-18 NOTE — Progress Notes (Signed)
Office Visit Note  Patient: Sarah Ramos             Date of Birth: 12/16/1965           MRN: [id number]             PCP: Vena Austria, MD Referring: Maury Dus, MD Visit Date: 07/18/2016 Occupation: @GUAROCC @    Subjective:  Follow-up (has had stress past couple months aching ) and Joint Pain   History of Present Illness: Sarah Ramos is a 51 y.o. female  Last seen Jan 20, 2016.   neck surgery done by dr. Vertell Limber (75% improvement)  Pt is doing well w/ fms.  Some achiness at times w/ over-exertion.   Recently moved (bought a townhouse);  Hx of Ulcerative Colitis; treated w/ Remicade Infusions every 8 weeks at 50m/kg  (Rx'd by GI doctor, Dr. HBenson Norway. Pt states that labs were done initially but since her labs have been stable for so long, they no longer require labs for patient.     Activities of Daily Living:  Patient reports morning stiffness for 30 minutes.   Patient Reports nocturnal pain.  Difficulty dressing/grooming: Reports Difficulty climbing stairs: Denies Difficulty getting out of chair: Denies Difficulty using hands for taps, buttons, cutlery, and/or writing: Denies   Review of Systems  Constitutional: Positive for fatigue.  HENT: Negative for mouth sores and mouth dryness.   Eyes: Negative for dryness.  Respiratory: Negative for shortness of breath.   Gastrointestinal: Negative for constipation and diarrhea.  Musculoskeletal: Positive for myalgias and myalgias.  Skin: Negative for sensitivity to sunlight.  Psychiatric/Behavioral: Positive for sleep disturbance. Negative for decreased concentration.    PMFS History:  Patient Active Problem List   Diagnosis Date Noted  . Fatigue 01/20/2016  . Insomnia 01/20/2016  . Trapezius muscle spasm 01/20/2016  . Fibromyalgia 07/03/2012  . Colitis     Past Medical History:  Diagnosis Date  . Chronic fatigue   . CIN I (cervical intraepithelial neoplasia I) 2005  . Colitis   .  Fibromyalgia   . Skin rash     Family History  Problem Relation Age of Onset  . Breast cancer Mother 448 . Cancer Mother     Bone cancer  . Heart disease Paternal Grandmother   . Heart disease Paternal Grandfather    Past Surgical History:  Procedure Laterality Date  . CHOLECYSTECTOMY    . GYNECOLOGIC CRYOSURGERY  1991   CIN 1  . INTRAUTERINE DEVICE INSERTION  01-2006, 02/2011, 05/05/2016   Mirena   Social History   Social History Narrative  . No narrative on file     Objective: Vital Signs: BP 122/80   Pulse 78   Resp 16   Ht 5' 7.5" (1.715 m)   Wt 250 lb (113.4 kg)   BMI 38.58 kg/m    Physical Exam  Constitutional: She is oriented to person, place, and time. She appears well-developed and well-nourished.  HENT:  Head: Normocephalic and atraumatic.  Eyes: EOM are normal. Pupils are equal, round, and reactive to light.  Cardiovascular: Normal rate, regular rhythm and normal heart sounds.  Exam reveals no gallop and no friction rub.   No murmur heard. Pulmonary/Chest: Effort normal and breath sounds normal. She has no wheezes. She has no rales.  Abdominal: Soft. Bowel sounds are normal. She exhibits no distension. There is no tenderness. There is no guarding. No hernia.  Musculoskeletal: Normal range of motion. She exhibits no edema, tenderness or deformity.  Lymphadenopathy:    She has no cervical adenopathy.  Neurological: She is alert and oriented to person, place, and time. Coordination normal.  Skin: Skin is warm and dry. Capillary refill takes less than 2 seconds. No rash noted.  Psychiatric: She has a normal mood and affect. Her behavior is normal.  Nursing note and vitals reviewed.    Musculoskeletal Exam:    CDAI Exam: CDAI Homunculus Exam:   Joint Counts:  CDAI Tender Joint count: 0 CDAI Swollen Joint count: 0  Global Assessments:  Patient Global Assessment: 4 Provider Global Assessment: 4  CDAI Calculated Score: 8    Investigation: No  additional findings.  No visits with results within 6 Month(s) from this visit.  Latest known visit with results is:  Office Visit on 09/16/2015  Component Date Value Ref Range Status  . WBC 09/16/2015 7.7  3.8 - 10.8 K/uL Final  . RBC 09/16/2015 4.30  3.80 - 5.10 MIL/uL Final  . Hemoglobin 09/16/2015 13.9  11.7 - 15.5 g/dL Final  . HCT 09/16/2015 40.5  35.0 - 45.0 % Final  . MCV 09/16/2015 94.2  80.0 - 100.0 fL Final  . MCH 09/16/2015 32.3  27.0 - 33.0 pg Final  . MCHC 09/16/2015 34.3  32.0 - 36.0 g/dL Final  . RDW 09/16/2015 13.3  11.0 - 15.0 % Final  . Platelets 09/16/2015 325  140 - 400 K/uL Final  . MPV 09/16/2015 10.3  7.5 - 12.5 fL Final  . Neutro Abs 09/16/2015 3927  1,500 - 7,800 cells/uL Final  . Lymphs Abs 09/16/2015 3080  850 - 3,900 cells/uL Final  . Monocytes Absolute 09/16/2015 462  200 - 950 cells/uL Final  . Eosinophils Absolute 09/16/2015 231  15 - 500 cells/uL Final  . Basophils Absolute 09/16/2015 0  0 - 200 cells/uL Final  . Neutrophils Relative % 09/16/2015 51  % Final  . Lymphocytes Relative 09/16/2015 40  % Final  . Monocytes Relative 09/16/2015 6  % Final  . Eosinophils Relative 09/16/2015 3  % Final  . Basophils Relative 09/16/2015 0  % Final  . Smear Review 09/16/2015 Criteria for review not met   Final  . Sodium 09/16/2015 136  135 - 146 mmol/L Final  . Potassium 09/16/2015 4.2  3.5 - 5.3 mmol/L Final  . Chloride 09/16/2015 104  98 - 110 mmol/L Final  . CO2 09/16/2015 21  20 - 31 mmol/L Final  . Glucose, Bld 09/16/2015 93  65 - 99 mg/dL Final  . BUN 09/16/2015 10  7 - 25 mg/dL Final  . Creat 09/16/2015 0.82  0.50 - 1.10 mg/dL Final  . Total Bilirubin 09/16/2015 0.6  0.2 - 1.2 mg/dL Final  . Alkaline Phosphatase 09/16/2015 56  33 - 115 U/L Final  . AST 09/16/2015 20  10 - 35 U/L Final  . ALT 09/16/2015 23  6 - 29 U/L Final  . Total Protein 09/16/2015 6.5  6.1 - 8.1 g/dL Final  . Albumin 09/16/2015 4.1  3.6 - 5.1 g/dL Final  . Calcium 09/16/2015 8.8   8.6 - 10.2 mg/dL Final  . Cholesterol 09/16/2015 196  125 - 200 mg/dL Final  . Triglycerides 09/16/2015 128  <150 mg/dL Final  . HDL 09/16/2015 45* >=46 mg/dL Final  . Total CHOL/HDL Ratio 09/16/2015 4.4  <=5.0 Ratio Final  . VLDL 09/16/2015 26  <30 mg/dL Final  . LDL Cholesterol 09/16/2015 125  <130 mg/dL Final   Comment:   Total Cholesterol/HDL Ratio:CHD Risk  Coronary Heart Disease Risk Table                                        Men       Women          1/2 Average Risk              3.4        3.3              Average Risk              5.0        4.4           2X Average Risk              9.6        7.1           3X Average Risk             23.4       11.0 Use the calculated Patient Ratio above and the CHD Risk table  to determine the patient's CHD Risk.   . Color, Urine 09/16/2015 YELLOW  YELLOW Final   Comment: ** Please note change in unit of measure and reference range(s). **     . APPearance 09/16/2015 CLEAR  CLEAR Final  . Specific Gravity, Urine 09/16/2015 1.011  1.001 - 1.035 Final  . pH 09/16/2015 6.5  5.0 - 8.0 Final  . Glucose, UA 09/16/2015 NEGATIVE  NEGATIVE Final  . Bilirubin Urine 09/16/2015 NEGATIVE  NEGATIVE Final  . Ketones, ur 09/16/2015 NEGATIVE  NEGATIVE Final  . Hgb urine dipstick 09/16/2015 TRACE* NEGATIVE Final  . Protein, ur 09/16/2015 NEGATIVE  NEGATIVE Final  . Nitrite 09/16/2015 NEGATIVE  NEGATIVE Final  . Leukocytes, UA 09/16/2015 NEGATIVE  NEGATIVE Final  . WBC, UA 09/16/2015 NONE SEEN  <=5 WBC/HPF Final  . RBC / HPF 09/16/2015 NONE SEEN  <=2 RBC/HPF Final  . Squamous Epithelial / LPF 09/16/2015 0-5  <=5 HPF Final  . Bacteria, UA 09/16/2015 NONE SEEN  NONE SEEN HPF Final  . Crystals 09/16/2015 NONE SEEN  NONE SEEN HPF Final  . Casts 09/16/2015 NONE SEEN  NONE SEEN LPF Final  . Yeast 09/16/2015 NONE SEEN  NONE SEEN HPF Final  . Specimen adequacy: 09/16/2015 SEE NOTE   Final  . FINAL DIAGNOSIS: 09/16/2015 SEE NOTE   Final     Comment: - NEGATIVE FOR INTRAEPITHELIAL LESIONS OR MALIGNANCY.   Marland Kitchen COMMENTS: 09/16/2015 SEE NOTE   Final  . Cytotechnologist: 09/16/2015 SEE NOTE   Final   Comment: MJS, BA CT(ASCP) *  The Pap is a screening test for cervical cancer. It is not a  diagnostic test and is subject to false negative and false positive  results. It is most reliable when a satisfactory sample, regularly  obtained, is submitted with relevant clinical findings and history,  and when the Pap result is evaluated along with historic and current  clinical information.      Imaging: No results found.  Speciality Comments: No specialty comments available.    Procedures:  No procedures performed Allergies: Codeine; Tetracyclines & related; and Topamax   Assessment / Plan:     Visit Diagnoses: Fibromyalgia - "4-6" on scale of 0-10 on pain scale  Fatigue, unspecified type - Plan: CBC with Differential/Platelet, COMPLETE METABOLIC PANEL WITH GFR  Primary insomnia  Trapezius muscle spasm - 07/18/2016:  doing well.  Weight loss - 07/18/2016: about 11 lbs since last visit (oct 2017) w/ diet and exercise.   Hx of Ulcerative Colitis; treated w/ Remicade Infusions every 8 weeks at 81m/kg  (Rx'd by GI doctor, Dr. HBenson Norway. Pt states that labs were done initially but since her labs have been stable for so long, they no longer require labs for patient.   Orders: Orders Placed This Encounter  Procedures  . CBC with Differential/Platelet  . COMPLETE METABOLIC PANEL WITH GFR   Meds ordered this encounter  Medications  . baclofen (LIORESAL) 10 MG tablet    Sig: Take 1 tablet (10 mg total) by mouth 2 (two) times daily.    Dispense:  180 tablet    Refill:  1    Order Specific Question:   Supervising Provider    Answer:   DBo Merino[[id number]]  . cyclobenzaprine (FLEXERIL) 10 MG tablet    Sig: Take 1 tablet (10 mg total) by mouth at bedtime.    Dispense:  90 tablet    Refill:  1    Order Specific Question:    Supervising Provider    Answer:   DBo [phone]   Face-to-face time spent with patient was 30 minutes. 50% of time was spent in counseling and coordination of care.  Follow-Up Instructions: Return in about 6 months (around 01/17/2017) for FMS,//FATIGUE//INSOMNIA.//U.C.-Remicade q 8 weeks-dr. hung//.   NEliezer Lofts PA-C  Note - This record has been created using [employer]  Chart creation errors have been sought, but may not always  have been located. Such creation errors do not reflect on  the standard of medical care.

## 2016-07-18 NOTE — Patient Instructions (Addendum)
    Iliotibial Band Syndrome Iliotibial band syndrome (ITBS) is a condition that often causes knee pain. It can also cause pain in the outside of your hip, thigh, and knee. The iliotibial band is a strip of tissue that runs from the outside of your hip and down your thigh to the outside of your knee. Repeatedly bending and straightening your knee can irritate the iliotibial band. What are the causes? This condition is caused by inflammation and irritation from the friction of the iliotibial band moving over the thigh bone (femur) when you repeatedly bend and straighten your knee. What increases the risk? This condition is more likely to develop in people who:  Frequently change elevation during their workouts.  Run very long distances.  Recently increased the length or intensity of their workouts.  Run downhill often, or just started running downhill.  Ride a bike very far or often. You may also be at greater risk if you start a new workout routine without first warming up or if you have a job that requires you to bend, squat, or climb frequently. What are the signs or symptoms? Symptoms of this condition include:  Pain along the outside of your knee that may be worse with activity, especially running or going up and down stairs.  A "snapping" sensation over your knee.  Swelling on the outside of your knee.  Pain or a feeling of tightness in your hip. How is this diagnosed? This condition is diagnosed based on your symptoms, medical history, and physical exam. You may also see a health care provider who specializes in reducing pain and increasing mobility (physical therapist). A physical therapist may do an exam to check your balance, movement, and way of walking or running (gait) to see whether the way you move could contribute to your injury. You may also have tests to measure your strength, flexibility, and range of motion. How is this treated? Treatment for this condition  includes:  Resting and limiting exercise.  Returning to activities gradually.  Doing range-of-motion and strengthening exercises (physical therapy) as told by your health care provider.  Including low-impact activities, such as swimming, in your exercise routine. Follow these instructions at home:  If directed, apply ice to the injured area.  Put ice in a plastic bag.  Place a towel between your skin and the bag.  Leave the ice on for 20 minutes, 2-3 times per day.  Return to your normal activities as told by your health care provider. Ask your health care provider what activities are safe for you.  Keep all follow-up visits with your health care provider. This is important. Contact a health care provider if:  Your pain does not improve or gets worse despite treatment. This information is not intended to replace advice given to you by your health care provider. Make sure you discuss any questions you have with your health care provider. Document Released: 09/02/2001 Document Revised: 03/06/2016 Document Reviewed: 08/13/2015 Elsevier Interactive Patient Education  2017 Elsevier Inc.  

## 2016-09-22 ENCOUNTER — Encounter: Payer: BLUE CROSS/BLUE SHIELD | Admitting: Gynecology

## 2016-11-02 ENCOUNTER — Encounter: Payer: Self-pay | Admitting: Gynecology

## 2016-11-02 ENCOUNTER — Ambulatory Visit (INDEPENDENT_AMBULATORY_CARE_PROVIDER_SITE_OTHER): Payer: BLUE CROSS/BLUE SHIELD | Admitting: Gynecology

## 2016-11-02 VITALS — BP 122/78 | Ht 67.0 in | Wt 252.0 lb

## 2016-11-02 DIAGNOSIS — Z13220 Encounter for screening for lipoid disorders: Secondary | ICD-10-CM | POA: Diagnosis not present

## 2016-11-02 DIAGNOSIS — Z30431 Encounter for routine checking of intrauterine contraceptive device: Secondary | ICD-10-CM | POA: Diagnosis not present

## 2016-11-02 DIAGNOSIS — Z01419 Encounter for gynecological examination (general) (routine) without abnormal findings: Secondary | ICD-10-CM | POA: Diagnosis not present

## 2016-11-02 NOTE — Patient Instructions (Addendum)
Schedule your mammogram  Follow up for fasting lab work.

## 2016-11-02 NOTE — Progress Notes (Signed)
    Sarah Ramos April 23, 1965 [id number]        51 y.o.  G1P1001 for annual exam.    Past medical history,surgical history, problem list, medications, allergies, family history and social history were all reviewed and documented as reviewed in the EPIC chart.  ROS:  Performed with pertinent positives and negatives included in the history, assessment and plan.   Additional significant findings :  None   Exam: Sarah Ramos assistant Vitals:   11/02/16 1546  BP: 122/78  Weight: 252 lb (114.3 kg)  Height: 5\' 7"  (1.702 m)   Body mass index is 39.47 kg/m.  General appearance:  Normal affect, orientation and appearance. Skin: Grossly normal HEENT: Without gross lesions.  No cervical or supraclavicular adenopathy. Thyroid normal.  Lungs:  Clear without wheezing, rales or rhonchi Cardiac: RR, without RMG Abdominal:  Soft, nontender, without masses, guarding, rebound, organomegaly or hernia Breasts:  Examined lying and sitting without masses, retractions, discharge or axillary adenopathy. Pelvic:  Ext, BUS, Vagina: Normal  Cervix: Normal. IUD string visualized  Uterus: Anteverted, normal size, shape and contour, midline and mobile nontender   Adnexa: Without masses or tenderness    Anus and perineum: Normal   Rectovaginal: Normal sphincter tone without palpated masses or tenderness.    Assessment/Plan:  51 y.o. 691P1001 female for annual exam without menses, Mirena IUD.   1. Mirena IUD 04/2016. Doing well without menses. IUD string visualized. 2. Pap smear/HPV 06/2012. Pap smear 2017. No Pap smear done today. History CIN-1 1991 with cryosurgery. CIN-1 2005 with expectant management is normal Pap smears since. Plan follow up Pap smear at 3 year interval per current screening guidelines. 3. Mammography 05/2015. Patient reminded she is overdue and she agrees to call and schedule. Breast exam normal today. Self breast awareness discussed. 4. Health maintenance. Future orders placed for  CBC, CMP and lipid profile. Patient will follow up when she is fasting to have these done. Follow up in one year for annual exam, sooner as needed.   Dara LordsFONTAINE,Daphanie Oquendo P MD, 4:29 PM 11/02/2016

## 2016-12-13 ENCOUNTER — Ambulatory Visit: Payer: BLUE CROSS/BLUE SHIELD | Admitting: Rheumatology

## 2016-12-28 ENCOUNTER — Encounter: Payer: Self-pay | Admitting: Rheumatology

## 2016-12-28 ENCOUNTER — Ambulatory Visit (INDEPENDENT_AMBULATORY_CARE_PROVIDER_SITE_OTHER): Payer: BLUE CROSS/BLUE SHIELD | Admitting: Rheumatology

## 2016-12-28 VITALS — BP 130/102 | HR 74 | Resp 16 | Ht 67.5 in | Wt 252.0 lb

## 2016-12-28 DIAGNOSIS — M797 Fibromyalgia: Secondary | ICD-10-CM | POA: Diagnosis not present

## 2016-12-28 DIAGNOSIS — R5383 Other fatigue: Secondary | ICD-10-CM | POA: Diagnosis not present

## 2016-12-28 DIAGNOSIS — M62838 Other muscle spasm: Secondary | ICD-10-CM | POA: Diagnosis not present

## 2016-12-28 DIAGNOSIS — F5101 Primary insomnia: Secondary | ICD-10-CM | POA: Diagnosis not present

## 2016-12-28 MED ORDER — CYCLOBENZAPRINE HCL 10 MG PO TABS: 10.0000 mg | ORAL_TABLET | Freq: Every day | ORAL | 1 refills | Status: AC

## 2016-12-28 MED ORDER — BACLOFEN 10 MG PO TABS: 10.0000 mg | ORAL_TABLET | Freq: Two times a day (BID) | ORAL | 1 refills | Status: DC

## 2016-12-28 MED ORDER — DICLOFENAC SODIUM 1 % TD GEL: TRANSDERMAL | 3 refills | Status: AC

## 2016-12-28 NOTE — Progress Notes (Signed)
Office Visit Note  Patient: Sarah Ramos             Date of Birth: Aug 04, 1965           MRN: [id number]             PCP: Maury Dus, MD Referring: Maury Dus, MD Visit Date: 12/28/2016 Occupation: @GUAROCC @    Subjective:  Medication Management  History of Present Illness: Sarah Ramos is a 51 y.o. female  Last seen  Fms ==> 3 on scale of 0-10 No flare. Got tx for ulcerative colitis and once that is controlled, fms is controlled (remicade) Before U.C. 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  Patient uses Flexeril every night ifor muscle spasm and to help her sleep. Flexeril is slightly heavier dose compared to Zanaflex. Therefore most of the time she requires the Flexeril0 heavier dose). On the rare occasion that she does not want that heavy dose, she does hve some type tizandine left and she uses that for the "lighter dose". Patient is also aware that she does not you need to use the medication every night and only uses it when necessary.  Similarly, she has baclofen available for daytime muscle relaxation. She uses it when necessary.On most days, she finds that she does not need the baclofen. But she does have good results with the medication if and when she needs it.  Activities of Daily Living:  Patient reports morning stiffness for 15 minutes.   Patient Denies nocturnal pain.  Difficulty dressing/grooming: Denies Difficulty climbing stairs: Denies Difficulty getting out of chair: Denies Difficulty using hands for taps, buttons, cutlery, and/or writing: Denies   No Rheumatology ROS completed.   PMFS History:  Patient Active Problem List   Diagnosis Date Noted  . Fatigue 01/20/2016  . Insomnia 01/20/2016  . Trapezius muscle spasm 01/20/2016  . Fibromyalgia 07/03/2012  . Colitis     Past Medical History:  Diagnosis Date  . Chronic fatigue   . CIN I (cervical intraepithelial neoplasia I) 2005  . Colitis   .  Fibromyalgia   . Skin rash     Family History  Problem Relation Age of Onset  . Breast cancer Mother 68  . Cancer Mother        Bone cancer  . Heart disease Paternal Grandmother   . Heart disease Paternal Grandfather    Past Surgical History:  Procedure Laterality Date  . CERVICAL DISC SURGERY    . CHOLECYSTECTOMY    . GYNECOLOGIC CRYOSURGERY  1991   CIN 1  . INTRAUTERINE DEVICE INSERTION  01-2006, 02/2011, 05/05/2016   Mirena   Social History   Social History Narrative  . No narrative on file     Objective: Vital Signs: BP (!) 130/102   Pulse 74   Resp 16   Ht 5' 7.5" (1.715 m)   Wt 252 lb (114.3 kg)   BMI 38.89 kg/m    Physical Exam   Musculoskeletal Exam:  Full range of motion of all joints Grip strength is equal and strong bilaterally Fiber myalgia tender points are 4 out of 18 positive (bilateral trapezius muscleand bilateral greater trochanteric bursa)  CDAI Exam: No CDAI exam completed.  No synovitis  Investigation: No additional findings. Office Visit on 07/18/2016  Component Date Value Ref Range Status  . WBC 07/18/2016 10.1  3.8 - 10.8 K/uL Final  . RBC 07/18/2016 4.63  3.80 - 5.10 MIL/uL Final  .  Hemoglobin 07/18/2016 14.6  11.7 - 15.5 g/dL Final  . HCT 07/18/2016 43.3  35.0 - 45.0 % Final  . MCV 07/18/2016 93.5  80.0 - 100.0 fL Final  . MCH 07/18/2016 31.5  27.0 - 33.0 pg Final  . MCHC 07/18/2016 33.7  32.0 - 36.0 g/dL Final  . RDW 07/18/2016 13.2  11.0 - 15.0 % Final  . Platelets 07/18/2016 341  140 - 400 K/uL Final  . MPV 07/18/2016 10.5  7.5 - 12.5 fL Final  . Neutro Abs 07/18/2016 5050  1,500 - 7,800 cells/uL Final  . Lymphs Abs 07/18/2016 4242* 850 - 3,900 cells/uL Final  . Monocytes Absolute 07/18/2016 707  200 - 950 cells/uL Final  . Eosinophils Absolute 07/18/2016 101  15 - 500 cells/uL Final  . Basophils Absolute 07/18/2016 0  0 - 200 cells/uL Final  . Neutrophils Relative % 07/18/2016 50  % Final  . Lymphocytes Relative 07/18/2016  42  % Final  . Monocytes Relative 07/18/2016 7  % Final  . Eosinophils Relative 07/18/2016 1  % Final  . Basophils Relative 07/18/2016 0  % Final  . Smear Review 07/18/2016 Criteria for review not met   Final  . Sodium 07/18/2016 136  135 - 146 mmol/L Final  . Potassium 07/18/2016 4.6  3.5 - 5.3 mmol/L Final  . Chloride 07/18/2016 101  98 - 110 mmol/L Final  . CO2 07/18/2016 23  20 - 31 mmol/L Final  . Glucose, Bld 07/18/2016 80  65 - 99 mg/dL Final  . BUN 07/18/2016 10  7 - 25 mg/dL Final  . Creat 07/18/2016 0.83  0.50 - 1.10 mg/dL Final  . Total Bilirubin 07/18/2016 0.4  0.2 - 1.2 mg/dL Final  . Alkaline Phosphatase 07/18/2016 68  33 - 115 U/L Final  . AST 07/18/2016 23  10 - 35 U/L Final  . ALT 07/18/2016 23  6 - 29 U/L Final  . Total Protein 07/18/2016 7.0  6.1 - 8.1 g/dL Final  . Albumin 07/18/2016 4.3  3.6 - 5.1 g/dL Final  . Calcium 07/18/2016 9.6  8.6 - 10.2 mg/dL Final  . Globulin 07/18/2016 2.7  1.9 - 3.7 g/dL Final  . AG Ratio 07/18/2016 1.6  1.0 - 2.5 Ratio Final  . BUN/Creatinine Ratio 07/18/2016 12.0  6 - 22 Ratio Final  . GFR, Est African American 07/18/2016 >89  >=60 mL/min Final  . GFR, Est Non African American 07/18/2016 83  >=60 mL/min Final     Imaging: No results found.  Speciality Comments: No specialty comments available.   Procedures:  No procedures performed Allergies: Codeine; Tetracyclines & related; and Topamax   Assessment / Plan:     Visit Diagnoses: Fibromyalgia  Fatigue, unspecified type  Primary insomnia  Trapezius muscle spasm   Plan: #1: Fibromyalg 4 out of 18 tender points. Overall, patient is doing very well. Note that she has a history of ulcerative colitis. When she has her  Ulcerative colitis under control, her fibromyalgia is well controlled. Such, her fibromyalgia pain is rated 3 on a scale of 0-10 this week. Last week when her ulcerative colitis was not well-controlled, her fiber pain was rated 6-7 on a scale of  0-10.  #2: Fatigue and insomnia. Patient does well with Flexeril 10 mg daily at bedtime; baclofen when necessary 10 mg up to twice a day. (She rarely uses baclofen).  #3: Sometimes Flexeril is too strong for her. She has some type tizanidine available. When the Flexeril is too strong,  she uses Tyson tizanidine which is gentler on her period patient knows not to mix these 2 medications and use them together.  #4: Bilateral trapezius muscle spasm. Does not need cortisone injection at this time.  #5 bilateral greater trochanteric bursitis. Mild discomfort. Does not need cortisone injection at this time.  #6: Return to clinic in 6 months  #7: Patient requesting refill on Voltaren gel. 5 tubes with 3 refillswas called in.  #8: Refill Flexeril and baclofen exactly as written at the last visit in April.  #9:patient continues to exercise and manage her fibromyalgia with proper activities. She will continue these good habitsto minimize her fiber flares.  Orders: No orders of the defined types were placed in this encounter.  Meds ordered this encounter  Medications  . diclofenac sodium (VOLTAREN) 1 % GEL    Sig: Voltaren Gel 3 grams to 3 large joints upto TID 5 TUBES with 3 refills    Dispense:  5 Tube    Refill:  3    Order Specific Question:   Supervising Provider    Answer:   Bo Merino [[id number]]  . cyclobenzaprine (FLEXERIL) 10 MG tablet    Sig: Take 1 tablet (10 mg total) by mouth at bedtime.    Dispense:  90 tablet    Refill:  1    Order Specific Question:   Supervising Provider    Answer:   Bo Merino [[id number]]  . baclofen (LIORESAL) 10 MG tablet    Sig: Take 1 tablet (10 mg total) by mouth 2 (two) times daily.    Dispense:  180 tablet    Refill:  1    Order Specific Question:   Supervising Provider    Answer:   Bo Merino [phone]    Face-to-face time spent with patient was 20 minutes. 50% of time was spent in counseling and coordination of care.  Follow-Up  Instructions: Return in about 6 months (around 06/28/2017) for FMS, FATIGUE,INSOMNIA,U.C.-remicade helps.   Eliezer Lofts, PA-C  Note - This record has been created using [employer].  Chart creation errors have been sought, but may not always  have been located. Such creation errors do not reflect on  the standard of medical care.

## 2017-01-16 ENCOUNTER — Ambulatory Visit
Admission: RE | Admit: 2017-01-16 | Discharge: 2017-01-16 | Disposition: A | Discharge status: Home / Self Care | Payer: BLUE CROSS/BLUE SHIELD | Source: Ambulatory Visit | Attending: Gynecology | Admitting: Gynecology

## 2017-01-16 ENCOUNTER — Other Ambulatory Visit: Payer: Self-pay | Admitting: Gynecology

## 2017-01-16 DIAGNOSIS — Z1231 Encounter for screening mammogram for malignant neoplasm of breast: Secondary | ICD-10-CM

## 2017-01-18 ENCOUNTER — Ambulatory Visit: Payer: BLUE CROSS/BLUE SHIELD | Admitting: Rheumatology

## 2017-01-30 ENCOUNTER — Ambulatory Visit: Payer: BLUE CROSS/BLUE SHIELD | Admitting: Rheumatology

## 2017-02-02 DIAGNOSIS — K5190 Ulcerative colitis, unspecified, without complications: Secondary | ICD-10-CM | POA: Diagnosis not present

## 2017-04-06 DIAGNOSIS — K5190 Ulcerative colitis, unspecified, without complications: Secondary | ICD-10-CM | POA: Diagnosis not present

## 2017-04-06 DIAGNOSIS — Z79899 Other long term (current) drug therapy: Secondary | ICD-10-CM | POA: Diagnosis not present

## 2017-06-01 DIAGNOSIS — K5190 Ulcerative colitis, unspecified, without complications: Secondary | ICD-10-CM | POA: Diagnosis not present

## 2017-07-27 DIAGNOSIS — K5190 Ulcerative colitis, unspecified, without complications: Secondary | ICD-10-CM | POA: Diagnosis not present

## 2017-09-21 DIAGNOSIS — K5190 Ulcerative colitis, unspecified, without complications: Secondary | ICD-10-CM | POA: Diagnosis not present

## 2017-09-21 DIAGNOSIS — Z79899 Other long term (current) drug therapy: Secondary | ICD-10-CM | POA: Diagnosis not present

## 2017-10-25 DIAGNOSIS — K5190 Ulcerative colitis, unspecified, without complications: Secondary | ICD-10-CM | POA: Diagnosis not present

## 2017-10-25 DIAGNOSIS — K5100 Ulcerative (chronic) pancolitis without complications: Secondary | ICD-10-CM | POA: Diagnosis not present

## 2017-10-25 DIAGNOSIS — K5140 Inflammatory polyps of colon without complications: Secondary | ICD-10-CM | POA: Diagnosis not present

## 2017-11-09 ENCOUNTER — Encounter: Payer: BLUE CROSS/BLUE SHIELD | Admitting: Gynecology

## 2017-11-16 ENCOUNTER — Encounter: Payer: BLUE CROSS/BLUE SHIELD | Admitting: Gynecology

## 2017-11-16 ENCOUNTER — Encounter: Payer: Self-pay | Admitting: Gynecology

## 2017-11-16 ENCOUNTER — Ambulatory Visit (INDEPENDENT_AMBULATORY_CARE_PROVIDER_SITE_OTHER): Payer: BLUE CROSS/BLUE SHIELD | Admitting: Gynecology

## 2017-11-16 VITALS — BP 122/78 | Ht 67.0 in | Wt 257.0 lb

## 2017-11-16 DIAGNOSIS — N951 Menopausal and female climacteric states: Secondary | ICD-10-CM | POA: Diagnosis not present

## 2017-11-16 DIAGNOSIS — Z13220 Encounter for screening for lipoid disorders: Secondary | ICD-10-CM | POA: Diagnosis not present

## 2017-11-16 DIAGNOSIS — Z01419 Encounter for gynecological examination (general) (routine) without abnormal findings: Secondary | ICD-10-CM

## 2017-11-16 NOTE — Patient Instructions (Signed)
Follow-up in 1 year for annual exam.  Follow-up sooner if your menopausal symptoms worsen and you want to discuss treatment options.

## 2017-11-16 NOTE — Progress Notes (Signed)
    Sarah Ramos [birth year] [id number]        52 y.o.  G1P1001 for annual gynecologic exam.  Having occasional spotting with Mirena IUD.  Is developing hot flushes and sweats over the past year.  Past medical history,surgical history, problem list, medications, allergies, family history and social history were all reviewed and documented as reviewed in the EPIC chart.  ROS:  Performed with pertinent positives and negatives included in the history, assessment and plan.   Additional significant findings : None   Exam: Kennon PortelaKim Gardner assistant Vitals:   11/16/17 0828  BP: 122/78  Weight: 257 lb (116.6 kg)  Height: 5\' 7"  (1.702 m)   Body mass index is 40.25 kg/m.  General appearance:  Normal affect, orientation and appearance. Skin: Grossly normal HEENT: Without gross lesions.  No cervical or supraclavicular adenopathy. Thyroid normal.  Lungs:  Clear without wheezing, rales or rhonchi Cardiac: RR, without RMG Abdominal:  Soft, nontender, without masses, guarding, rebound, organomegaly or hernia Breasts:  Examined lying and sitting without masses, retractions, discharge or axillary adenopathy. Pelvic:  Ext, BUS, Vagina: Normal  Cervix: Normal.  IUD string visualized.  Pap smear done today  Uterus: Anteverted, normal size, shape and contour, midline and mobile nontender   Adnexa: Without masses or tenderness    Anus and perineum: Normal   Rectovaginal: Normal sphincter tone without palpated masses or tenderness.    Assessment/Plan:  52 y.o. 391P1001 female for annual gynecologic exam.  1. Mirena IUD 04/2016.  Doing well without menses.  Occasional spotting.  Strings visualized. 2. Perimenopausal symptoms with hot flushes and sweats.  Will check FSH/TSH today.  Discussed treatment options and patient not interested in HRT at this point.  Will try OTC products.  Follow-up if symptoms worsen and wants to rediscuss treatment options. 3. Pap smear/HPV 2014.  Pap smear done today.   History of CIN-1 1991 with cryosurgery.  CIN-1 2005 with expectant management and normal Pap smears since. 4. Health maintenance.  Requests baseline labs.  CBC, CMP, lipid profile, TSH ordered.  Follow-up 1 year, sooner as needed.   Dara Lordsimothy P Betrice Wanat MD, 9:03 AM 11/16/2017

## 2017-11-16 NOTE — Addendum Note (Signed)
Addended by: Dayna BarkerGARDNER, Gavan Nordby K on: 11/16/2017 09:10 AM   Modules accepted: Orders

## 2017-11-17 LAB — COMPREHENSIVE METABOLIC PANEL
AG Ratio: 1.6 (calc) (ref 1.0–2.5)
ALT: 19 U/L (ref 6–29)
AST: 17 U/L (ref 10–35)
Albumin: 4.4 g/dL (ref 3.6–5.1)
Alkaline phosphatase (APISO): 60 U/L (ref 33–130)
BUN: 11 mg/dL (ref 7–25)
CHLORIDE: 105 mmol/L (ref 98–110)
CO2: 20 mmol/L (ref 20–32)
CREATININE: 0.78 mg/dL (ref 0.50–1.05)
Calcium: 9.2 mg/dL (ref 8.6–10.4)
GLOBULIN: 2.7 g/dL (ref 1.9–3.7)
GLUCOSE: 96 mg/dL (ref 65–99)
Potassium: 4.2 mmol/L (ref 3.5–5.3)
Sodium: 138 mmol/L (ref 135–146)
Total Bilirubin: 0.6 mg/dL (ref 0.2–1.2)
Total Protein: 7.1 g/dL (ref 6.1–8.1)

## 2017-11-17 LAB — CBC WITH DIFFERENTIAL/PLATELET
BASOS PCT: 0.5 %
Basophils Absolute: 42 cells/uL (ref 0–200)
EOS ABS: 218 {cells}/uL (ref 15–500)
Eosinophils Relative: 2.6 %
HCT: 40.8 % (ref 35.0–45.0)
Hemoglobin: 14.2 g/dL (ref 11.7–15.5)
Lymphs Abs: 2806 cells/uL (ref 850–3900)
MCH: 32.7 pg (ref 27.0–33.0)
MCHC: 34.8 g/dL (ref 32.0–36.0)
MCV: 94 fL (ref 80.0–100.0)
MONOS PCT: 5.9 %
MPV: 11 fL (ref 7.5–12.5)
Neutro Abs: 4838 cells/uL (ref 1500–7800)
Neutrophils Relative %: 57.6 %
PLATELETS: 313 10*3/uL (ref 140–400)
RBC: 4.34 10*6/uL (ref 3.80–5.10)
RDW: 12.1 % (ref 11.0–15.0)
TOTAL LYMPHOCYTE: 33.4 %
WBC mixed population: 496 cells/uL (ref 200–950)
WBC: 8.4 10*3/uL (ref 3.8–10.8)

## 2017-11-17 LAB — LIPID PANEL
CHOL/HDL RATIO: 4 (calc) (ref <5.0)
Cholesterol: 206 mg/dL — ABNORMAL HIGH (ref <200)
HDL: 51 mg/dL (ref >50)
LDL CHOLESTEROL (CALC): 133 mg/dL — AB
NON-HDL CHOLESTEROL (CALC): 155 mg/dL — AB (ref <130)
TRIGLYCERIDES: 111 mg/dL (ref <150)

## 2017-11-17 LAB — TSH: TSH: 1.74 mIU/L

## 2017-11-17 LAB — FOLLICLE STIMULATING HORMONE: FSH: 48.5 m[IU]/mL

## 2017-11-19 ENCOUNTER — Encounter: Payer: Self-pay | Admitting: Gynecology

## 2017-11-19 LAB — PAP IG W/ RFLX HPV ASCU

## 2017-11-23 DIAGNOSIS — K5190 Ulcerative colitis, unspecified, without complications: Secondary | ICD-10-CM | POA: Diagnosis not present

## 2018-01-02 DIAGNOSIS — K5190 Ulcerative colitis, unspecified, without complications: Secondary | ICD-10-CM | POA: Diagnosis not present

## 2018-01-18 DIAGNOSIS — K5190 Ulcerative colitis, unspecified, without complications: Secondary | ICD-10-CM | POA: Diagnosis not present

## 2018-03-15 DIAGNOSIS — K5190 Ulcerative colitis, unspecified, without complications: Secondary | ICD-10-CM | POA: Diagnosis not present

## 2018-03-15 DIAGNOSIS — Z79899 Other long term (current) drug therapy: Secondary | ICD-10-CM | POA: Diagnosis not present

## 2018-05-10 DIAGNOSIS — K5190 Ulcerative colitis, unspecified, without complications: Secondary | ICD-10-CM | POA: Diagnosis not present

## 2018-07-11 DIAGNOSIS — K5190 Ulcerative colitis, unspecified, without complications: Secondary | ICD-10-CM | POA: Diagnosis not present

## 2018-09-06 DIAGNOSIS — Z79899 Other long term (current) drug therapy: Secondary | ICD-10-CM | POA: Diagnosis not present

## 2018-09-06 DIAGNOSIS — K5190 Ulcerative colitis, unspecified, without complications: Secondary | ICD-10-CM | POA: Diagnosis not present

## 2018-11-01 DIAGNOSIS — K5190 Ulcerative colitis, unspecified, without complications: Secondary | ICD-10-CM | POA: Diagnosis not present

## 2018-11-12 ENCOUNTER — Other Ambulatory Visit: Payer: Self-pay | Admitting: Gynecology

## 2018-11-12 DIAGNOSIS — Z1231 Encounter for screening mammogram for malignant neoplasm of breast: Secondary | ICD-10-CM

## 2018-11-13 ENCOUNTER — Ambulatory Visit (INDEPENDENT_AMBULATORY_CARE_PROVIDER_SITE_OTHER): Payer: BC Managed Care – PPO | Admitting: Family Medicine

## 2018-11-13 ENCOUNTER — Encounter: Payer: Self-pay | Admitting: Family Medicine

## 2018-11-13 ENCOUNTER — Other Ambulatory Visit: Payer: Self-pay

## 2018-11-13 VITALS — BP 142/90 | Ht 67.0 in | Wt 256.0 lb

## 2018-11-13 DIAGNOSIS — M25579 Pain in unspecified ankle and joints of unspecified foot: Secondary | ICD-10-CM

## 2018-11-13 DIAGNOSIS — M25561 Pain in right knee: Secondary | ICD-10-CM | POA: Diagnosis not present

## 2018-11-13 DIAGNOSIS — G8929 Other chronic pain: Secondary | ICD-10-CM

## 2018-11-13 NOTE — Patient Instructions (Addendum)
Your foot and ankle pain are due to your pronation. Do ankle strengthening exercises 3 sets of 10 once a day with theraband. Wear green insoles with scaphoid pads when up and walking around - you can switch these in and out of different shoes. Icing, tylenol, aleve only if needed. If you do well with this and want to get custom orthotics let us know - they cost 190 dollars and last longer.  For insurance purposes you would ask if they cover them (the code is L3030 if they ask).  Your knee pain is due to a combination of mild arthritis and patellofemoral syndrome (issue of kneecap tracking). Avoid painful activities when possible (often deep squats, lunges bother this). Cross train with swimming, cycling with low resistance, elliptical if needed. Straight leg raise, hip side raises, straight leg raises with foot turned outwards 3 sets of 10 once a day. Add ankle weight if these become too easy. Consider formal physical therapy. The arch supports will help this as well. Avoid flat shoes, barefoot walking as much as possible. Icing 15 minutes at a time 3-4 times a day as needed. Tylenol or aleve as needed for pain. Follow up with me in 6 weeks.  We talked about boswellia extract, curcumin, pycnogenol which are supplements that may help with arthritis.

## 2018-11-13 NOTE — Progress Notes (Signed)
PCP: Maury Dus, MD  Subjective:   HPI: Patient is a 53 y.o. female here for right knee, bilateral foot pain.  Patient reports she's had issues with her right knee for several years. She had a couple falls on this knee that preceded this - had radiographs in past, most recently 3 weeks ago and told she had a little arthritis in her knee. Noticed pain more as she lives in a townhouse now and using stairs gets a loud grinding sound in front of right knee. Not much pain involved with this though. Not taking medication for this. Also with pain with both feet especially when standing, walking, exercising. Pain is not localized to a particular area. No skin changes, numbness.  Past Medical History:  Diagnosis Date  . Chronic fatigue   . CIN I (cervical intraepithelial neoplasia I) 2005  . Colitis   . Fibromyalgia   . Skin rash     Current Outpatient Medications on File Prior to Visit  Medication Sig Dispense Refill  . aspirin EC 81 MG tablet Take 81 mg by mouth daily.    . Cholecalciferol (VITAMIN D PO) Take by mouth.    . Cyanocobalamin (VITAMIN B 12 PO) Take by mouth.    . diclofenac sodium (VOLTAREN) 1 % GEL Voltaren Gel 3 grams to 3 large joints upto TID 5 TUBES with 3 refills 5 Tube 3  . furosemide (LASIX) 20 MG tablet TK 1 T PO ONCE A DAY  3  . HYDROXYZINE HCL PO Take 25 mg by mouth daily. Patient states she takes 1/4 to half a tab at bedtime.    . inFLIXimab (REMICADE) 100 MG injection Inject into the vein.    Marland Kitchen levonorgestrel (MIRENA) 20 MCG/24HR IUD 1 each by Intrauterine route once. 1 each 0  . Multiple Vitamin (MULTIVITAMIN) capsule Take 1 capsule by mouth daily.       Current Facility-Administered Medications on File Prior to Visit  Medication Dose Route Frequency Provider Last Rate Last Dose  . levonorgestrel (MIRENA) 20 MCG/24HR IUD   Intrauterine Once Fontaine, Belinda Block, MD        Past Surgical History:  Procedure Laterality Date  . CERVICAL DISC SURGERY    .  CHOLECYSTECTOMY    . GYNECOLOGIC CRYOSURGERY  1991   CIN 1  . INTRAUTERINE DEVICE INSERTION  01-2006, 02/2011, 05/05/2016   Mirena    Allergies  Allergen Reactions  . Codeine Nausea And Vomiting  . Tetracyclines & Related Nausea And Vomiting  . Topamax Nausea And Vomiting    Social History   Socioeconomic History  . Marital status: Single    Spouse name: Not on file  . Number of children: Not on file  . Years of education: Not on file  . Highest education level: Not on file  Occupational History  . Not on file  Social Needs  . Financial resource strain: Not on file  . Food insecurity    Worry: Not on file    Inability: Not on file  . Transportation needs    Medical: Not on file    Non-medical: Not on file  Tobacco Use  . Smoking status: Never Smoker  . Smokeless tobacco: Never Used  Substance and Sexual Activity  . Alcohol use: Yes    Alcohol/week: 1.0 standard drinks    Types: 1 Standard drinks or equivalent per week    Comment: Social  . Drug use: No  . Sexual activity: Yes    Birth control/protection: I.U.D.  Comment: MIRENA inserted 05/05/2016-1st intercourse 53 yo-More than 5 partners  Lifestyle  . Physical activity    Days per week: Not on file    Minutes per session: Not on file  . Stress: Not on file  Relationships  . Social Musicianconnections    Talks on phone: Not on file    Gets together: Not on file    Attends religious service: Not on file    Active member of club or organization: Not on file    Attends meetings of clubs or organizations: Not on file    Relationship status: Not on file  . Intimate partner violence    Fear of current or ex partner: Not on file    Emotionally abused: Not on file    Physically abused: Not on file    Forced sexual activity: Not on file  Other Topics Concern  . Not on file  Social History Narrative  . Not on file    Family History  Problem Relation Age of Onset  . Breast cancer Mother 1448  . Cancer Mother         Bone cancer  . Heart disease Paternal Grandmother   . Heart disease Paternal Grandfather     BP (!) 142/90   Ht 5\' 7"  (1.702 m)   Wt 256 lb (116.1 kg)   BMI 40.10 kg/m   Review of Systems: See HPI above.     Objective:  Physical Exam:  Gen: NAD, comfortable in exam room  Right knee: No gross deformity, ecchymoses, swelling.  Crepitus with flexion to extension lateral patella. No TTP. FROM with 5/5 strength flexion and extension. Negative ant/post drawers. Negative valgus/varus testing. Negative lachmans. Negative mcmurrays, apleys, patellar apprehension. NV intact distally.  Left knee: No deformity.  Mild crepitus lateral patella flexion to extension. FROM with 5/5 strength. No tenderness to palpation. NVI distally.  Right foot/ankle: No gross deformity, swelling, ecchymoses.  Mod pronation.  Transverse arch collapse with mild splaying all digits.  Varus deformity of 5th MTP. FROM with 5/5 strength all directions. No TTP Negative ant drawer and talar tilt.   Negative syndesmotic compression. Thompsons test negative. NV intact distally.  Left foot/ankle: No gross deformity, swelling, ecchymoses.  Mod pronation.  Transverse arch collapse with mild splaying all digits.  Varus deformity of 5th MTP. FROM with 5/5 strength all directions. No TTP Negative ant drawer and talar tilt.   Negative syndesmotic compression. Thompsons test negative. NV intact distally.   Assessment & Plan:  1. Right knee pain - 2/2 mild arthropathy and patellofemoral syndrome.  Reassured patient - more an issue of sound knee makes than true pain.  We reviewed home exercise program to start.  Icing, tylenol or aleve.  F?u in 6 weeks.  Discussed supplements that may help.  2. Bilateral foot/ankle pain - exam reassuring.  Related to her pronation.  Sports insoles with scaphoid pads.  Consider custom orthotics if does well.  Icing, tylenol, aleve only if needed.

## 2018-11-21 ENCOUNTER — Other Ambulatory Visit: Payer: Self-pay

## 2018-11-22 ENCOUNTER — Encounter: Payer: Self-pay | Admitting: Gynecology

## 2018-11-22 ENCOUNTER — Ambulatory Visit (INDEPENDENT_AMBULATORY_CARE_PROVIDER_SITE_OTHER): Payer: BC Managed Care – PPO | Admitting: Gynecology

## 2018-11-22 VITALS — BP 122/78 | Ht 67.0 in | Wt 258.0 lb

## 2018-11-22 DIAGNOSIS — Z13220 Encounter for screening for lipoid disorders: Secondary | ICD-10-CM | POA: Diagnosis not present

## 2018-11-22 DIAGNOSIS — Z01419 Encounter for gynecological examination (general) (routine) without abnormal findings: Secondary | ICD-10-CM

## 2018-11-22 DIAGNOSIS — L290 Pruritus ani: Secondary | ICD-10-CM | POA: Diagnosis not present

## 2018-11-22 MED ORDER — FLUCONAZOLE 150 MG PO TABS
150.0000 mg | ORAL_TABLET | Freq: Every day | ORAL | 0 refills | Status: DC
Start: 2018-11-22 — End: 2019-11-25

## 2018-11-22 MED ORDER — NYSTATIN-TRIAMCINOLONE 100000-0.1 UNIT/GM-% EX CREA: 1.0000 "application " | TOPICAL_CREAM | Freq: Two times a day (BID) | CUTANEOUS | 1 refills | Status: DC

## 2018-11-22 NOTE — Patient Instructions (Signed)
Take the Diflucan pills daily for 5 days.  Apply the prescribed cream to the symptomatic areas twice daily.  Follow-up in 2 months for reexamination

## 2018-11-22 NOTE — Progress Notes (Signed)
    Sarah Ramos 1966-02-06 [id number]        52 y.o.  G1P1001 for annual gynecologic exam.  Also complaining of several months of perianal itching.  No vaginal discharge or vaginal irritation.  No odor.  Past medical history,surgical history, problem list, medications, allergies, family history and social history were all reviewed and documented as reviewed in the EPIC chart.  ROS:  Performed with pertinent positives and negatives included in the history, assessment and plan.   Additional significant findings : None   Exam: Caryn Bee assistant Vitals:   11/22/18 0823  BP: 122/78  Weight: 258 lb (117 kg)  Height: 5\' 7"  (1.702 m)   Body mass index is 40.41 kg/m.  General appearance:  Normal affect, orientation and appearance. Skin: Grossly normal HEENT: Without gross lesions.  No cervical or supraclavicular adenopathy. Thyroid normal.  Lungs:  Clear without wheezing, rales or rhonchi Cardiac: RR, without RMG Abdominal:  Soft, nontender, without masses, guarding, rebound, organomegaly or hernia Breasts:  Examined lying and sitting without masses, retractions, discharge or axillary adenopathy. Pelvic:  Ext, BUS, Vagina: Normal  Cervix: Normal.  IUD string visualized  Uterus: Anteverted, normal size, shape and contour, midline and mobile nontender   Adnexa: Without masses or tenderness    Anus and perineum: With hyperkeratotic appearance lower perineal body/superior perianal region  Rectovaginal: Normal sphincter tone without palpated masses or tenderness.    Assessment/Plan:  53 y.o. G61P1001 female for annual gynecologic exam.   1. Perianal itching.  Will treat for fungal with Diflucan 150 mg daily x5 days and Mycolog equivalent cream twice daily.  Given the hyperkeratotic appearance of asked her to return in 2 months regardless for reexamination.  I discussed possible biopsy if the skin still appears hyperkeratotic.  If complete resolution then will follow. 2. Mirena IUD  04/2016.  Doing well without menses.  No significant menopausal symptoms.  FSH was elevated last year.  Will plan removing at 5-year mark. 3. Mammography 2018.  Patient has scheduled and will follow-up for this.  Breast exam normal today. 4. Pap smear 2019 normal.  No Pap smear done today.  History of CIN-1 1991 with cryosurgery.  CIN-1 2005 with expectant management and normal Pap smears since.  Plan repeat Pap smear at 3-year interval. 5. Health maintenance.  CBC, CMP and lipid profile ordered.  TSH normal last year.  Follow-up in 2 months.  Follow-up in 1 year for annual exam   Anastasio Auerbach MD, 8:48 AM 11/22/2018

## 2018-11-23 LAB — COMPREHENSIVE METABOLIC PANEL
AG Ratio: 1.8 (calc) (ref 1.0–2.5)
ALT: 21 U/L (ref 6–29)
AST: 19 U/L (ref 10–35)
Albumin: 4.3 g/dL (ref 3.6–5.1)
Alkaline phosphatase (APISO): 67 U/L (ref 37–153)
BUN: 14 mg/dL (ref 7–25)
CO2: 22 mmol/L (ref 20–32)
Calcium: 9.4 mg/dL (ref 8.6–10.4)
Chloride: 106 mmol/L (ref 98–110)
Creat: 0.8 mg/dL (ref 0.50–1.05)
Globulin: 2.4 g/dL (calc) (ref 1.9–3.7)
Glucose, Bld: 98 mg/dL (ref 65–99)
Potassium: 4.2 mmol/L (ref 3.5–5.3)
Sodium: 138 mmol/L (ref 135–146)
Total Bilirubin: 0.4 mg/dL (ref 0.2–1.2)
Total Protein: 6.7 g/dL (ref 6.1–8.1)

## 2018-11-23 LAB — CBC WITH DIFFERENTIAL/PLATELET
Absolute Monocytes: 547 cells/uL (ref 200–950)
Basophils Absolute: 39 cells/uL (ref 0–200)
Basophils Relative: 0.5 %
Eosinophils Absolute: 177 cells/uL (ref 15–500)
Eosinophils Relative: 2.3 %
HCT: 42 % (ref 35.0–45.0)
Hemoglobin: 14.2 g/dL (ref 11.7–15.5)
Lymphs Abs: 3134 cells/uL (ref 850–3900)
MCH: 32.6 pg (ref 27.0–33.0)
MCHC: 33.8 g/dL (ref 32.0–36.0)
MCV: 96.6 fL (ref 80.0–100.0)
MPV: 11.2 fL (ref 7.5–12.5)
Monocytes Relative: 7.1 %
Neutro Abs: 3804 cells/uL (ref 1500–7800)
Neutrophils Relative %: 49.4 %
Platelets: 309 10*3/uL (ref 140–400)
RBC: 4.35 10*6/uL (ref 3.80–5.10)
RDW: 12.3 % (ref 11.0–15.0)
Total Lymphocyte: 40.7 %
WBC: 7.7 10*3/uL (ref 3.8–10.8)

## 2018-11-23 LAB — LIPID PANEL
Cholesterol: 203 mg/dL — ABNORMAL HIGH (ref <200)
HDL: 51 mg/dL (ref >=50)
LDL Cholesterol (Calc): 131 mg/dL (calc) — ABNORMAL HIGH
Non-HDL Cholesterol (Calc): 152 mg/dL (calc) — ABNORMAL HIGH (ref <130)
Total CHOL/HDL Ratio: 4 (calc) (ref <5.0)
Triglycerides: 99 mg/dL (ref <150)

## 2018-11-25 ENCOUNTER — Encounter: Payer: Self-pay | Admitting: Gynecology

## 2018-12-18 ENCOUNTER — Encounter: Payer: Self-pay | Admitting: Gynecology

## 2018-12-25 ENCOUNTER — Ambulatory Visit: Payer: BC Managed Care – PPO | Admitting: Family Medicine

## 2018-12-25 ENCOUNTER — Ambulatory Visit
Admission: RE | Admit: 2018-12-25 | Discharge: 2018-12-25 | Disposition: A | Discharge status: Home / Self Care | Payer: BC Managed Care – PPO | Source: Ambulatory Visit | Attending: Gynecology | Admitting: Gynecology

## 2018-12-25 ENCOUNTER — Other Ambulatory Visit: Payer: Self-pay

## 2018-12-25 DIAGNOSIS — Z1231 Encounter for screening mammogram for malignant neoplasm of breast: Secondary | ICD-10-CM | POA: Diagnosis not present

## 2018-12-27 DIAGNOSIS — K5190 Ulcerative colitis, unspecified, without complications: Secondary | ICD-10-CM | POA: Diagnosis not present

## 2019-01-08 DIAGNOSIS — K5190 Ulcerative colitis, unspecified, without complications: Secondary | ICD-10-CM | POA: Diagnosis not present

## 2019-01-22 ENCOUNTER — Ambulatory Visit: Payer: BC Managed Care – PPO | Admitting: Family Medicine

## 2019-01-24 ENCOUNTER — Ambulatory Visit (INDEPENDENT_AMBULATORY_CARE_PROVIDER_SITE_OTHER): Payer: BC Managed Care – PPO | Admitting: Gynecology

## 2019-01-24 ENCOUNTER — Encounter: Payer: Self-pay | Admitting: Gynecology

## 2019-01-24 ENCOUNTER — Other Ambulatory Visit: Payer: Self-pay

## 2019-01-24 VITALS — BP 122/76

## 2019-01-24 DIAGNOSIS — N898 Other specified noninflammatory disorders of vagina: Secondary | ICD-10-CM | POA: Diagnosis not present

## 2019-01-24 LAB — WET PREP FOR TRICH, YEAST, CLUE

## 2019-01-24 MED ORDER — CLINDAMYCIN PHOSPHATE 2 % VA CREA: 1.0000 | TOPICAL_CREAM | Freq: Every day | VAGINAL | 0 refills | Status: DC

## 2019-01-24 NOTE — Patient Instructions (Signed)
Use the vaginal cream nightly for 7 nights to treat the vaginal infection.  Try starting on over-the-counter boric acid 600 mg vaginal suppositories once or twice weekly to see if it does not prevent recurrent vaginal infections.

## 2019-01-24 NOTE — Progress Notes (Signed)
    Sarah Ramos [birth year] [id number]        52 y.o.  G1P1001 presents for reexamination.  Recently seen at her annual exam 2 months ago with perianal itching.  Exam showed hyperkeratotic skin lower perineal body superior perianal region.  It was thought due to yeast and was treated with Diflucan 150 mg x 5 days and Mycolog cream twice daily.  The patient notes that her symptoms have totally resolved.  She does note now vaginal discharge with odor.  She has frequent recurrences sometimes lengthy intercourse.  Past medical history,surgical history, problem list, medications, allergies, family history and social history were all reviewed and documented in the EPIC chart.  Directed ROS with pertinent positives and negatives documented in the history of present illness/assessment and plan.  Exam: Caryn Bee assistant Vitals:   01/24/19 1558  BP: 122/76   General appearance:  Normal Abdomen soft nontender without mass guarding rebound Pelvic external BUS vagina with normal-appearing perineal body.  Complete resolution of the prior hyperkeratotic appearing skin.  Slight white discharge noted.  Cervix normal.  IUD string visualized.  Uterus grossly normal midline mobile nontender.  Adnexa without masses or tenderness.  Assessment/Plan:  53 y.o. G1P1001 with history and exam above.  Prior hyperkeratotic area totally resolved.  Patient will monitor and represent if there is any recurrent symptoms.  Now with vaginal discharge and odor.  Wet prep consistent with bacterial vaginosis.  Options for management reviewed and will treat with Cleocin vaginal cream nightly x7 nights.  Also suggested starting boric acid vaginal suppositories 600 mg OTC once or twice weekly to see if that does not help prevent recurrent vaginitis.  Follow-up if symptoms persist, worsen or recur.   Anastasio Auerbach MD, 4:06 PM 01/24/2019

## 2019-01-26 ENCOUNTER — Encounter: Payer: Self-pay | Admitting: Gynecology

## 2019-01-27 MED ORDER — METRONIDAZOLE 500 MG PO TABS: 500.0000 mg | ORAL_TABLET | Freq: Two times a day (BID) | ORAL | 0 refills | Status: DC

## 2019-02-28 DIAGNOSIS — K5190 Ulcerative colitis, unspecified, without complications: Secondary | ICD-10-CM | POA: Diagnosis not present

## 2019-02-28 DIAGNOSIS — Z79899 Other long term (current) drug therapy: Secondary | ICD-10-CM | POA: Diagnosis not present

## 2019-11-25 ENCOUNTER — Ambulatory Visit (INDEPENDENT_AMBULATORY_CARE_PROVIDER_SITE_OTHER): Payer: No Typology Code available for payment source | Admitting: Obstetrics and Gynecology

## 2019-11-25 ENCOUNTER — Other Ambulatory Visit: Payer: Self-pay

## 2019-11-25 ENCOUNTER — Encounter: Payer: Self-pay | Admitting: Obstetrics and Gynecology

## 2019-11-25 VITALS — BP 122/80 | Ht 67.0 in | Wt 265.0 lb

## 2019-11-25 DIAGNOSIS — N951 Menopausal and female climacteric states: Secondary | ICD-10-CM

## 2019-11-25 DIAGNOSIS — Z01419 Encounter for gynecological examination (general) (routine) without abnormal findings: Secondary | ICD-10-CM | POA: Diagnosis not present

## 2019-11-25 DIAGNOSIS — Z13220 Encounter for screening for lipoid disorders: Secondary | ICD-10-CM

## 2019-11-25 NOTE — Progress Notes (Signed)
Sarah Ramos 04-Oct-1965 [id number]  SUBJECTIVE:  54 y.o. G1P1001 female for annual routine gynecologic exam. She has no gynecologic concerns. Minor hot flashes. Amenorrheic with IUD.   Current Outpatient Medications  Medication Sig Dispense Refill  . aspirin EC 81 MG tablet Take 81 mg by mouth daily.    . Cholecalciferol (VITAMIN D PO) Take by mouth.    . Cyanocobalamin (VITAMIN B 12 PO) Take by mouth.    . furosemide (LASIX) 20 MG tablet TK 1 T PO ONCE A DAY  3  . HYDROXYZINE HCL PO Take 25 mg by mouth daily. Patient states she takes 1/4 to half a tab at bedtime.    . inFLIXimab (REMICADE) 100 MG injection Inject into the vein.    . Multiple Vitamin (MULTIVITAMIN) capsule Take 1 capsule by mouth daily.      . diclofenac sodium (VOLTAREN) 1 % GEL Voltaren Gel 3 grams to 3 large joints upto TID 5 TUBES with 3 refills (Patient not taking: Reported on 11/25/2019) 5 Tube 3  . levonorgestrel (MIRENA) 20 MCG/24HR IUD 1 each by Intrauterine route once. 1 each 0  . nystatin-triamcinolone (MYCOLOG II) cream Apply 1 application topically 2 (two) times daily. (Patient not taking: Reported on 11/25/2019) 30 g 1   Current Facility-Administered Medications  Medication Dose Route Frequency Provider Last Rate Last Admin  . levonorgestrel (MIRENA) 20 MCG/24HR IUD   Intrauterine Once Fontaine, Nadyne Coombes, MD       Allergies: Codeine, Tetracyclines & related, and Topamax  No LMP recorded. (Menstrual status: IUD).  Past medical history,surgical history, problem list, medications, allergies, family history and social history were all reviewed and documented as reviewed in the EPIC chart.  ROS:  Performed with pertinent positives and negatives included in the history, assessment and plan.   Additional significant findings : None   OBJECTIVE:  BP 122/80 (Cuff Size: Large)   Ht 5\' 7"  (1.702 m)   Wt 265 lb (120.2 kg)   BMI 41.50 kg/m  The patient appears well, alert, oriented x 3, in no  distress. Lungs are clear, good air entry, no wheezes, rhonchi or rales. S1 and S2 normal, no murmurs, regular rate and rhythm.  Abdomen soft without tenderness, guarding, mass or organomegaly.  Neurological is normal, no focal findings.  BREAST EXAM: breasts appear normal, no suspicious masses, no skin or nipple changes or axillary nodes  PELVIC EXAM: VULVA: normal appearing vulva with no masses, tenderness or lesions, VAGINA: normal appearing vagina with normal color and discharge, no lesions, CERVIX: IUD strings visualized and palpated, normal appearing cervix without discharge or lesions, UTERUS: uterus is difficult to palpate but feels normal size, shape, consistency and nontender, ADNEXA: normal adnexa in size, nontender and no masses  Chaperone: present during the examination  ASSESSMENT:  54 y.o. G1P1001 here for annual gynecologic exam  PLAN:   1.  Perimenopause.  Mirena IUD 04/2016.  Doing well, no menses.  Plan to remove after 5 years in place.  FSH elevated 2019, will recheck now.  Minor hot flashes.  Prefers not to use any hormones due to family history of breast cancer.  Discussed OTC products will let 2020 know if needing more than that. 2. Pap smear 2019 normal.  No Pap smear done today.  History of CIN-1 1991 with cryosurgery.  CIN-1 2005 with expectant management and normal Pap smears since.  Plan repeat Pap smear in 2022 at 3-year interval. 3. Mammogram 11/2018.  Normal breast exam today.  States she has a mammogram coming up this next month. 4. Colonoscopy 2019.  She will follow up at the recommended interval, followed frequently due to ulcerative colitis. 5. Health maintenance.  She will proceed to lab today for routine screening blood work (lipids, CBC, CMP) and the [facility] as above.  Return annually or sooner, prn.  Theresia Majors MD 11/25/19

## 2019-11-26 LAB — COMPREHENSIVE METABOLIC PANEL
AG Ratio: 1.7 (calc) (ref 1.0–2.5)
ALT: 20 U/L (ref 6–29)
AST: 18 U/L (ref 10–35)
Albumin: 4.4 g/dL (ref 3.6–5.1)
Alkaline phosphatase (APISO): 80 U/L (ref 37–153)
BUN: 15 mg/dL (ref 7–25)
CO2: 21 mmol/L (ref 20–32)
Calcium: 9.6 mg/dL (ref 8.6–10.4)
Chloride: 104 mmol/L (ref 98–110)
Creat: 0.92 mg/dL (ref 0.50–1.05)
Globulin: 2.6 g/dL (calc) (ref 1.9–3.7)
Glucose, Bld: 93 mg/dL (ref 65–99)
Potassium: 4.2 mmol/L (ref 3.5–5.3)
Sodium: 138 mmol/L (ref 135–146)
Total Bilirubin: 0.5 mg/dL (ref 0.2–1.2)
Total Protein: 7 g/dL (ref 6.1–8.1)

## 2019-11-26 LAB — CBC
HCT: 41.7 % (ref 35.0–45.0)
Hemoglobin: 14.3 g/dL (ref 11.7–15.5)
MCH: 32.6 pg (ref 27.0–33.0)
MCHC: 34.3 g/dL (ref 32.0–36.0)
MCV: 95.2 fL (ref 80.0–100.0)
MPV: 11.3 fL (ref 7.5–12.5)
Platelets: 323 10*3/uL (ref 140–400)
RBC: 4.38 Million/uL (ref 3.80–5.10)
RDW: 12.3 % (ref 11.0–15.0)
WBC: 8 10*3/uL (ref 3.8–10.8)

## 2019-11-26 LAB — LIPID PANEL
Cholesterol: 219 mg/dL — ABNORMAL HIGH
HDL: 56 mg/dL
LDL Cholesterol (Calc): 135 mg/dL — ABNORMAL HIGH
Non-HDL Cholesterol (Calc): 163 mg/dL — ABNORMAL HIGH
Total CHOL/HDL Ratio: 3.9 (calc)
Triglycerides: 151 mg/dL — ABNORMAL HIGH

## 2019-11-26 LAB — FOLLICLE STIMULATING HORMONE: FSH: 87 m[IU]/mL

## 2020-05-19 ENCOUNTER — Other Ambulatory Visit: Payer: Self-pay | Admitting: Obstetrics and Gynecology

## 2020-05-19 DIAGNOSIS — Z1231 Encounter for screening mammogram for malignant neoplasm of breast: Secondary | ICD-10-CM

## 2020-07-08 ENCOUNTER — Ambulatory Visit: Payer: BC Managed Care – PPO

## 2020-08-20 ENCOUNTER — Ambulatory Visit: Payer: Self-pay

## 2020-08-24 ENCOUNTER — Other Ambulatory Visit: Payer: Self-pay

## 2020-08-24 ENCOUNTER — Ambulatory Visit
Admission: RE | Admit: 2020-08-24 | Discharge: 2020-08-24 | Disposition: A | Discharge status: Home / Self Care | Payer: No Typology Code available for payment source | Source: Ambulatory Visit | Attending: Obstetrics and Gynecology | Admitting: Obstetrics and Gynecology

## 2020-08-24 ENCOUNTER — Other Ambulatory Visit: Payer: Self-pay | Admitting: Nurse Practitioner

## 2020-08-24 DIAGNOSIS — Z1231 Encounter for screening mammogram for malignant neoplasm of breast: Secondary | ICD-10-CM

## 2020-10-14 ENCOUNTER — Ambulatory Visit: Payer: Self-pay

## 2020-11-25 ENCOUNTER — Other Ambulatory Visit: Payer: Self-pay

## 2020-11-25 ENCOUNTER — Ambulatory Visit (INDEPENDENT_AMBULATORY_CARE_PROVIDER_SITE_OTHER): Payer: No Typology Code available for payment source | Admitting: Nurse Practitioner

## 2020-11-25 ENCOUNTER — Encounter: Payer: No Typology Code available for payment source | Admitting: Obstetrics and Gynecology

## 2020-11-25 ENCOUNTER — Encounter: Payer: Self-pay | Admitting: Nurse Practitioner

## 2020-11-25 ENCOUNTER — Other Ambulatory Visit (HOSPITAL_COMMUNITY)
Admission: RE | Admit: 2020-11-25 | Discharge: 2020-11-25 | Disposition: A | Discharge status: Home / Self Care | Payer: No Typology Code available for payment source | Source: Ambulatory Visit | Attending: Obstetrics and Gynecology | Admitting: Obstetrics and Gynecology

## 2020-11-25 VITALS — BP 126/80 | Ht 66.5 in | Wt 273.0 lb

## 2020-11-25 DIAGNOSIS — R232 Flushing: Secondary | ICD-10-CM | POA: Diagnosis not present

## 2020-11-25 DIAGNOSIS — Z789 Other specified health status: Secondary | ICD-10-CM

## 2020-11-25 DIAGNOSIS — Z30431 Encounter for routine checking of intrauterine contraceptive device: Secondary | ICD-10-CM

## 2020-11-25 DIAGNOSIS — Z01419 Encounter for gynecological examination (general) (routine) without abnormal findings: Secondary | ICD-10-CM

## 2020-11-25 NOTE — Progress Notes (Signed)
DAILY DOE Aug 14, 1965 [id number]   History:  55 y.o. G1P1001 presents for annual exam. Amenorrheic/Mirena IUD 04/2016. 1991 cryosurgery CIN-1, 2005 CIN-1 without intervention, subsequent paps normal. FSH 87 one year ago. She is struggling with weight gain. 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 She has a treadmill but has knee/foot problems, so pain is an issue.   Gynecologic History No LMP recorded. (Menstrual status: IUD).   Contraception/Family planning: IUD Sexually active: Yes  Health Maintenance Last Pap: 11/16/2017. Results were: Normal, 3-year repeat Last mammogram: 08/24/2020. Results were: Normal Last colonoscopy: 12/2019. Results were: Normal Last Dexa: Not indicated  Past medical history, past surgical history, family history and social history were all reviewed and documented in the EPIC chart. Long-term boyfriend. Works for [employer]. 67 yo son. Mother diagnosed with breast cancer at 73.   ROS:  A ROS was performed and pertinent positives and negatives are included.  Exam:  Vitals:   11/25/20 0759  BP: 126/80  Weight: 273 lb (123.8 kg)  Height: 5' 6.5" (1.689 m)   Body mass index is 43.4 kg/m.  General appearance:  Normal Thyroid:  Symmetrical, normal in size, without palpable masses or nodularity. Respiratory  Auscultation:  Clear without wheezing or rhonchi Cardiovascular  Auscultation:  Regular rate, without rubs, murmurs or gallops  Edema/varicosities:  Not grossly evident Abdominal  Soft,nontender, without masses, guarding or rebound.  Liver/spleen:  No organomegaly noted  Hernia:  None appreciated  Skin  Inspection:  Grossly normal Breasts: Examined lying and sitting.   Right: Without masses, retractions, nipple discharge or axillary adenopathy.   Left: Without masses, retractions, nipple discharge or axillary adenopathy. Genitourinary   Inguinal/mons:  Normal without inguinal adenopathy  External genitalia:  Normal appearing  vulva with no masses, tenderness, or lesions  BUS/Urethra/Skene's glands:  Normal  Vagina:  Normal appearing with normal color and discharge, no lesions  Cervix:  Normal appearing without discharge or lesions. IUD strings visible  Uterus: Difficult to palpate due to body habitus but no gross masses or tenderness  Adnexa/parametria:     Rt: Normal in size, without masses or tenderness.   Lt: Normal in size, without masses or tenderness.  Anus and perineum: Normal  Digital rectal exam: Normal sphincter tone without palpated masses or tenderness  Patient informed chaperone available to be present for breast and pelvic exam. Patient has requested no chaperone to be present. Patient has been advised what will be completed during breast and pelvic exam.   Assessment/Plan:  55 y.o. G1P1001 for annual exam.   Well female exam with routine gynecological exam - Plan: Cytology - PAP( [place]). Education provided on SBEs, importance of preventative screenings, current guidelines, high calcium diet, regular exercise, and multivitamin daily.  Labs with PCP.   Encounter for routine checking of intrauterine contraceptive device (IUD) -amenorrheic.  Mirena IUD 04/2016.  FSH 87 1 year ago.  We will plan to remove next year.  Hot flashes -experiencing hot flashes and night sweats.  Mother had ER positive breast cancer at age 68.  HRT not recommended but we did discuss OTC supplements and lifestyle modifications.  Educated about management of weight -she has always struggled with weight gain.  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  She has a treadmill at home but has knee/foot problems, so pain is an issue.  We discussed importance of calorie deficit for weight loss, increasing activity with low impact cardio, standing and walking in  short intervals throughout her workday, avoid eating late at night, and intermittent fasting.  If unsuccessful she will discuss nutrition and medication management with  PCP or weight loss clinic.  Screening for cervical cancer - 1991 cryosurgery CIN-1, 2005 CIN-1, subsequent paps normal. Pap today.   Screening for breast cancer - Normal mammogram history.  Continue annual screenings.  Normal breast exam today.  Screening for colon cancer - 2021 colonoscopy. Will repeat at GI's recommended interval.   Return in 1 year for annual.   Olivia Mackie DNP, 8:38 AM 11/25/2020

## 2020-11-26 LAB — CYTOLOGY - PAP: Diagnosis: NEGATIVE

## 2021-07-06 ENCOUNTER — Other Ambulatory Visit: Payer: Self-pay | Admitting: Nurse Practitioner

## 2021-07-06 DIAGNOSIS — Z1231 Encounter for screening mammogram for malignant neoplasm of breast: Secondary | ICD-10-CM

## 2021-07-20 NOTE — Patient Instructions (Addendum)
DUE TO COVID-19 ONLY TWO VISITORS  (aged 55 and older)  ARE ALLOWED TO COME WITH YOU AND STAY IN THE WAITING ROOM ONLY DURING PRE OP AND PROCEDURE.   ?**NO VISITORS ARE ALLOWED IN THE SHORT STAY AREA OR RECOVERY ROOM!!** ? ?IF YOU WILL BE ADMITTED INTO THE HOSPITAL YOU ARE ALLOWED ONLY FOUR SUPPORT PEOPLE DURING VISITATION HOURS ONLY (7 AM -8PM)   ?The support person(s) must pass our screening, gel in and out, and wear a mask at all times, including in the patient?s room. ?Patients must also wear a mask when staff or their support person are in the room. ?Visitors GUEST BADGE MUST BE WORN VISIBLY  ?One adult visitor may remain with you overnight and MUST be in the room by 8 P.M. ?  ? ? Your procedure is scheduled on: 07/25/21 ? ? [facility] ? ?  Report to admitting at 12:45 PM ? ? Call this number if you have problems the morning of surgery [phone] ? ? Do not eat food :After Midnight. ? ? After Midnight you may have the following liquids until 12:00 PM DAY OF SURGERY ? ?Water ?Black Coffee (sugar ok, NO MILK/CREAM OR CREAMERS)  ?Tea (sugar ok, NO MILK/CREAM OR CREAMERS) regular and decaf                             ?Plain Jell-O (NO RED)                                           ?Fruit ices (not with fruit pulp, NO RED)                                     ?Popsicles (NO RED)                                                                  ?Juice: apple, WHITE grape, WHITE cranberry ?Sports drinks like Gatorade (NO RED) ?Clear broth(vegetable,chicken,beef) ? ?             ? ?  ?  ?The day of surgery:  ?Drink ONE (1) Pre-Surgery Clear Ensure at 12:00 PM the morning of surgery. Drink in one sitting. Do not sip.  ?This drink was given to you during your hospital  ?pre-op appointment visit. ?Nothing else to drink after completing the Pre-Surgery Clear Ensure 12:00 PM ?  ?       If you have questions, please contact your surgeon?s office. ? ? ?  ?  ?Oral Hygiene is also important to  reduce your risk of infection.                                    ?Remember - BRUSH YOUR TEETH THE MORNING OF SURGERY WITH YOUR REGULAR TOOTHPASTE ? ? ? Take these medicines the morning of surgery with A SIP OF WATER: none ?                 ?  You may not have any metal on your body including hair pins, jewelry, and body piercing ? ?           Do not wear make-up, lotions, powders, perfumes/cologne, or deodorant ? ?Do not wear nail polish including gel and S&S, artificial/acrylic nails, or any other type of covering on natural nails including finger and toenails. If you have artificial nails, gel coating, etc. that needs to be removed by a nail salon please have this removed prior to surgery or surgery may need to be canceled/ delayed if the surgeon/ anesthesia feels like they are unable to be safely monitored.  ? ?Do not shave  48 hours prior to surgery.  ? ? ? Do not bring valuables to the hospital. [facility] IS NOT RESPONSIBLE   FOR VALUABLES. ? ? Contacts, dentures or bridgework may not be worn into surgery. ? ? Bring small overnight bag day of surgery. ?  ? Please read over the following fact sheets you were given: IF YOU HAVE QUESTIONS ABOUT YOUR PRE-OP INSTRUCTIONS PLEASE CALL [phone] ? [facility]  ? ?   [facility] - Preparing for Surgery ?Before surgery, you can play an important role.  Because skin is not sterile, your skin needs to be as free of germs as possible.  You can reduce the number of germs on your skin by washing with CHG (chlorahexidine gluconate) soap before surgery.  CHG is an antiseptic cleaner which kills germs and bonds with the skin to continue killing germs even after washing. ?Please DO NOT use if you have an allergy to CHG or antibacterial soaps.  If your skin becomes reddened/irritated stop using the CHG and inform your nurse when you arrive at Short Stay. ?Do not shave (including legs and underarms) for at least 48 hours prior to the first CHG shower.   ?Please follow these  instructions carefully: ? 1.  Shower with CHG Soap the night before surgery and the  morning of Surgery. ? 2.  If you choose to wash your hair, wash your hair first as usual with your  normal  shampoo. ? 3.  After you shampoo, rinse your hair and body thoroughly to remove the  shampoo.                   ?         4.  Use CHG as you would any other liquid soap.  You can apply chg directly  to the skin and wash  ?                     Gently with a scrungie or clean washcloth. ? 5.  Apply the CHG Soap to your body ONLY FROM THE NECK DOWN.   Do not use on face/ open      ?                     Wound or open sores. Avoid contact with eyes, ears mouth and genitals (private parts).  ?                     Engineering geologist,  Genitals (private parts) with your normal soap. ?            6.  Wash thoroughly, paying special attention to the area where your surgery  will be performed. ? 7.  Thoroughly rinse your body with warm water from the neck down. ? 8.  DO NOT  shower/wash with your normal soap after using and rinsing off  the CHG Soap. ?               9.  Pat yourself dry with a clean towel. ?           10.  Wear clean pajamas. ?           11.  Place clean sheets on your bed the night of your first shower and do not  sleep with pets. ?Day of Surgery : ?Do not apply any lotions/deodorants the morning of surgery.  Please wear clean clothes to the hospital/surgery center. ? ?FAILURE TO FOLLOW THESE INSTRUCTIONS MAY RESULT IN THE CANCELLATION OF YOUR SURGERY ?PATIENT SIGNATURE_________________________________ ? ?NURSE SIGNATURE__________________________________ ? ?________________________________________________________________________  ? ?Incentive Spirometer ? ?An incentive spirometer is a tool that can help keep your lungs clear and active. This tool measures how well you are filling your lungs with each breath. Taking long deep breaths may help reverse or decrease the chance of developing breathing (pulmonary) problems (especially  infection) following: ?A long period of time when you are unable to move or be active. ?BEFORE THE PROCEDURE  ?If the spirometer includes an indicator to show your best effort, your nurse or respiratory therapist will set it to a desired goal. ?If possible, sit up straight or lean slightly forward. Try not to slouch. ?Hold the incentive spirometer in an upright position. ?INSTRUCTIONS FOR USE  ?Sit on the edge of your bed if possible, or sit up as far as you can in bed or on a chair. ?Hold the incentive spirometer in an upright position. ?Breathe out normally. ?Place the mouthpiece in your mouth and seal your lips tightly around it. ?Breathe in slowly and as deeply as possible, raising the piston or the ball toward the top of the column. ?Hold your breath for 3-5 seconds or for as long as possible. Allow the piston or ball to fall to the bottom of the column. ?Remove the mouthpiece from your mouth and breathe out normally. ?Rest for a few seconds and repeat Steps 1 through 7 at least 10 times every 1-2 hours when you are awake. Take your time and take a few normal breaths between deep breaths. ?The spirometer may include an indicator to show your best effort. Use the indicator as a goal to work toward during each repetition. ?After each set of 10 deep breaths, practice coughing to be sure your lungs are clear. If you have an incision (the cut made at the time of surgery), support your incision when coughing by placing a pillow or rolled up towels firmly against it. ?Once you are able to get out of bed, walk around indoors and cough well. You may stop using the incentive spirometer when instructed by your caregiver.  ?RISKS AND COMPLICATIONS ?Take your time so you do not get dizzy or light-headed. ?If you are in pain, you may need to take or ask for pain medication before doing incentive spirometry. It is harder to take a deep breath if you are having pain. ?AFTER USE ?Rest and breathe slowly and easily. ?It can be  helpful to keep track of a log of your progress. Your caregiver can provide you with a simple table to help with this. ?If you are using the spirometer at home, follow these instructions: ?SEEK MEDICAL C

## 2021-07-22 ENCOUNTER — Encounter (HOSPITAL_COMMUNITY)
Admission: RE | Admit: 2021-07-22 | Discharge: 2021-07-22 | Disposition: A | Discharge status: Home / Self Care | Payer: No Typology Code available for payment source | Source: Ambulatory Visit | Attending: Orthopedic Surgery | Admitting: Orthopedic Surgery

## 2021-07-22 ENCOUNTER — Other Ambulatory Visit: Payer: Self-pay

## 2021-07-22 ENCOUNTER — Encounter (HOSPITAL_COMMUNITY): Payer: Self-pay

## 2021-07-22 VITALS — BP 139/89 | HR 83 | Temp 98.3°F | Resp 16 | Ht 67.0 in | Wt 264.0 lb

## 2021-07-22 DIAGNOSIS — Z01812 Encounter for preprocedural laboratory examination: Secondary | ICD-10-CM | POA: Insufficient documentation

## 2021-07-22 DIAGNOSIS — Z01818 Encounter for other preprocedural examination: Secondary | ICD-10-CM

## 2021-07-22 HISTORY — DX: Ulcerative colitis, unspecified, without complications: K51.90

## 2021-07-22 HISTORY — DX: Gastro-esophageal reflux disease without esophagitis: K21.9

## 2021-07-22 HISTORY — DX: Headache, unspecified: R51.9

## 2021-07-22 HISTORY — DX: Unspecified osteoarthritis, unspecified site: M19.90

## 2021-07-22 LAB — BASIC METABOLIC PANEL
Anion gap: 6 (ref 5–15)
BUN: 13 mg/dL (ref 6–20)
CO2: 25 mmol/L (ref 22–32)
Calcium: 8.9 mg/dL (ref 8.9–10.3)
Chloride: 108 mmol/L (ref 98–111)
Creatinine, Ser: 0.84 mg/dL (ref 0.44–1.00)
GFR, Estimated: >60 mL/min (ref >60)
Glucose, Bld: 100 mg/dL — ABNORMAL HIGH (ref 70–99)
Potassium: 3.8 mmol/L (ref 3.5–5.1)
Sodium: 139 mmol/L (ref 135–145)

## 2021-07-22 LAB — CBC
HCT: 40 % (ref 36.0–46.0)
Hemoglobin: 13.5 g/dL (ref 12.0–15.0)
MCH: 32.4 pg (ref 26.0–34.0)
MCHC: 33.8 g/dL (ref 30.0–36.0)
MCV: 95.9 fL (ref 80.0–100.0)
Platelets: 250 10*3/uL (ref 150–400)
RBC: 4.17 MIL/uL (ref 3.87–5.11)
RDW: 13 % (ref 11.5–15.5)
WBC: 5.9 10*3/uL (ref 4.0–10.5)
nRBC: 0 % (ref 0.0–0.2)

## 2021-07-22 NOTE — Progress Notes (Signed)
?   07/22/21 0900  ?OBSTRUCTIVE SLEEP APNEA  ?Have you ever been diagnosed with sleep apnea through a sleep study? No  ?Do you snore loudly (loud enough to be heard through closed doors)?  1  ?Do you often feel tired, fatigued, or sleepy during the daytime (such as falling asleep during driving or talking to someone)? 1  ?Has anyone observed you stop breathing during your sleep? 0  ?Do you have, or are you being treated for high blood pressure? 0  ?BMI more than 35 kg/m2? 1  ?Age > 50 (1-yes) 1  ?Neck circumference greater than:Female 16 inches or larger, Female 17inches or larger? 1  ?Female Gender (Yes=1) 0  ?Obstructive Sleep Apnea Score 5  ?Score 5 or greater  Results sent to PCP  ? ? ?

## 2021-07-22 NOTE — Progress Notes (Signed)
COVID Vaccine Completed:  Yes x2 ?Date COVID Vaccine completed: ?Has received booster: ?COVID vaccine manufacturer:  [employer]  ? ?Date of COVID positive in last 90 days:  07-01-21 ? ?PCP - Maude Leriche, PA-C ?Cardiologist - N/A ? ?Chest x-ray -  N/A ?EKG -  N/A ?Stress Test -  N/A ?ECHO -  N/A ?Cardiac Cath -  N/A ?Pacemaker/ICD device last checked: ?Spinal Cord Stimulator: ? ?Bowel Prep - N/A ? ?Sleep Study - N/A ?CPAP -  ? ?Fasting Blood Sugar - N/A ?Checks Blood Sugar _____ times a day ? ?Blood Thinner Instructions: ?Aspirin Instructions:  ASA 81 mg.   ?Last Dose:  07-19-21 ? ?Activity level:   Can go up a flight of stairs and perform activities of daily living without stopping and without symptoms of chest pain or shortness of breath.  Limitations currently due to knee pain. ?   ?Anesthesia review:  Stop Bang 5 ? ?Patient denies shortness of breath, fever, cough and chest pain at PAT appointment ? ?Patient verbalized understanding of instructions that were given to them at the PAT appointment. Patient was also instructed that they will need to review over the PAT instructions again at home before surgery.  ?

## 2021-07-25 ENCOUNTER — Inpatient Hospital Stay (HOSPITAL_COMMUNITY)
Admission: RE | Admit: 2021-07-25 | Discharge: 2021-07-28 | DRG: 488 | Disposition: A | Discharge status: Skilled Nursing Facility | Payer: No Typology Code available for payment source | Attending: Orthopedic Surgery | Admitting: Orthopedic Surgery

## 2021-07-25 ENCOUNTER — Encounter (HOSPITAL_COMMUNITY)
Admission: RE | Disposition: A | Discharge status: Skilled Nursing Facility | Payer: Self-pay | Source: Home / Self Care | Attending: Orthopedic Surgery

## 2021-07-25 ENCOUNTER — Other Ambulatory Visit: Payer: Self-pay

## 2021-07-25 ENCOUNTER — Ambulatory Visit (HOSPITAL_COMMUNITY): Payer: No Typology Code available for payment source | Admitting: Anesthesiology

## 2021-07-25 ENCOUNTER — Encounter (HOSPITAL_COMMUNITY): Payer: Self-pay | Admitting: Orthopedic Surgery

## 2021-07-25 ENCOUNTER — Ambulatory Visit (HOSPITAL_BASED_OUTPATIENT_CLINIC_OR_DEPARTMENT_OTHER): Payer: No Typology Code available for payment source | Admitting: Anesthesiology

## 2021-07-25 ENCOUNTER — Ambulatory Visit (HOSPITAL_COMMUNITY): Payer: No Typology Code available for payment source

## 2021-07-25 DIAGNOSIS — Z9889 Other specified postprocedural states: Secondary | ICD-10-CM

## 2021-07-25 DIAGNOSIS — Z8249 Family history of ischemic heart disease and other diseases of the circulatory system: Secondary | ICD-10-CM

## 2021-07-25 DIAGNOSIS — S83412A Sprain of medial collateral ligament of left knee, initial encounter: Secondary | ICD-10-CM

## 2021-07-25 DIAGNOSIS — M25362 Other instability, left knee: Secondary | ICD-10-CM | POA: Diagnosis not present

## 2021-07-25 DIAGNOSIS — Z885 Allergy status to narcotic agent status: Secondary | ICD-10-CM

## 2021-07-25 DIAGNOSIS — Z6841 Body Mass Index (BMI) 40.0 and over, adult: Secondary | ICD-10-CM

## 2021-07-25 DIAGNOSIS — Z01818 Encounter for other preprocedural examination: Principal | ICD-10-CM

## 2021-07-25 DIAGNOSIS — M797 Fibromyalgia: Secondary | ICD-10-CM | POA: Diagnosis present

## 2021-07-25 DIAGNOSIS — M2352 Chronic instability of knee, left knee: Principal | ICD-10-CM | POA: Diagnosis present

## 2021-07-25 DIAGNOSIS — Z91040 Latex allergy status: Secondary | ICD-10-CM

## 2021-07-25 DIAGNOSIS — Z803 Family history of malignant neoplasm of breast: Secondary | ICD-10-CM

## 2021-07-25 DIAGNOSIS — Z79899 Other long term (current) drug therapy: Secondary | ICD-10-CM

## 2021-07-25 DIAGNOSIS — M94262 Chondromalacia, left knee: Secondary | ICD-10-CM | POA: Diagnosis present

## 2021-07-25 DIAGNOSIS — Z7982 Long term (current) use of aspirin: Secondary | ICD-10-CM

## 2021-07-25 DIAGNOSIS — Z87410 Personal history of cervical dysplasia: Secondary | ICD-10-CM

## 2021-07-25 DIAGNOSIS — Z888 Allergy status to other drugs, medicaments and biological substances status: Secondary | ICD-10-CM

## 2021-07-25 DIAGNOSIS — K219 Gastro-esophageal reflux disease without esophagitis: Secondary | ICD-10-CM | POA: Diagnosis present

## 2021-07-25 HISTORY — PX: KNEE ARTHROSCOPY WITH MEDIAL PATELLAR FEMORAL LIGAMENT RECONSTRUCTION: SHX5652

## 2021-07-25 LAB — CBC
HCT: 39.4 % (ref 36.0–46.0)
Hemoglobin: 13.5 g/dL (ref 12.0–15.0)
MCH: 32.9 pg (ref 26.0–34.0)
MCHC: 34.3 g/dL (ref 30.0–36.0)
MCV: 96.1 fL (ref 80.0–100.0)
Platelets: 247 10*3/uL (ref 150–400)
RBC: 4.1 MIL/uL (ref 3.87–5.11)
RDW: 12.9 % (ref 11.5–15.5)
WBC: 9.6 10*3/uL (ref 4.0–10.5)
nRBC: 0 % (ref 0.0–0.2)

## 2021-07-25 LAB — CREATININE, SERUM
Creatinine, Ser: 0.73 mg/dL (ref 0.44–1.00)
GFR, Estimated: >60 mL/min (ref >60)

## 2021-07-25 SURGERY — REPAIR, TENDON, PATELLAR, ARTHROSCOPIC
Anesthesia: General | Site: Knee | Laterality: Left

## 2021-07-25 MED ORDER — ONDANSETRON HCL 4 MG PO TABS
4.0000 mg | ORAL_TABLET | Freq: Four times a day (QID) | ORAL | Status: DC | PRN
  Administered 2021-07-26: 4 mg via ORAL
  Filled 2021-07-25: qty 1

## 2021-07-25 MED ORDER — ACETAMINOPHEN 500 MG PO TABS
500.0000 mg | ORAL_TABLET | Freq: Four times a day (QID) | ORAL | Status: AC
  Administered 2021-07-25 – 2021-07-26 (×3): 500 mg via ORAL
  Filled 2021-07-25 (×4): qty 1

## 2021-07-25 MED ORDER — HYDROCODONE-ACETAMINOPHEN 7.5-325 MG PO TABS
1.0000 | ORAL_TABLET | ORAL | Status: DC | PRN
  Administered 2021-07-25: 2 via ORAL
  Administered 2021-07-26: 1 via ORAL
  Administered 2021-07-26 – 2021-07-28 (×6): 2 via ORAL
  Administered 2021-07-28: 1 via ORAL
  Filled 2021-07-25: qty 2
  Filled 2021-07-25: qty 1
  Filled 2021-07-25 (×3): qty 2
  Filled 2021-07-25: qty 1
  Filled 2021-07-25 (×3): qty 2

## 2021-07-25 MED ORDER — FENTANYL CITRATE PF 50 MCG/ML IJ SOSY
25.0000 ug | PREFILLED_SYRINGE | INTRAMUSCULAR | Status: DC | PRN
  Administered 2021-07-25 (×3): 50 ug via INTRAVENOUS

## 2021-07-25 MED ORDER — MORPHINE SULFATE (PF) 2 MG/ML IV SOLN
0.5000 mg | INTRAVENOUS | Status: DC | PRN
  Filled 2021-07-25 (×2): qty 1

## 2021-07-25 MED ORDER — ENOXAPARIN SODIUM 40 MG/0.4ML IJ SOSY
40.0000 mg | PREFILLED_SYRINGE | INTRAMUSCULAR | Status: DC
  Administered 2021-07-26 – 2021-07-28 (×3): 40 mg via SUBCUTANEOUS
  Filled 2021-07-25 (×3): qty 0.4

## 2021-07-25 MED ORDER — PHENYLEPHRINE HCL (PRESSORS) 10 MG/ML IV SOLN
INTRAVENOUS | Status: DC | PRN
  Administered 2021-07-25: 80 ug via INTRAVENOUS
  Administered 2021-07-25: 160 ug via INTRAVENOUS

## 2021-07-25 MED ORDER — CEFAZOLIN SODIUM-DEXTROSE 2-4 GM/100ML-% IV SOLN
2.0000 g | Freq: Four times a day (QID) | INTRAVENOUS | Status: AC
  Administered 2021-07-25 – 2021-07-26 (×3): 2 g via INTRAVENOUS
  Filled 2021-07-25 (×3): qty 100

## 2021-07-25 MED ORDER — ONDANSETRON HCL 4 MG/2ML IJ SOLN
INTRAMUSCULAR | Status: AC
  Filled 2021-07-25: qty 2

## 2021-07-25 MED ORDER — DEXAMETHASONE SODIUM PHOSPHATE 10 MG/ML IJ SOLN
INTRAMUSCULAR | Status: DC | PRN
  Administered 2021-07-25: 5 mg

## 2021-07-25 MED ORDER — METOCLOPRAMIDE HCL 5 MG PO TABS: 5.0000 mg | ORAL_TABLET | Freq: Three times a day (TID) | ORAL | Status: DC | PRN

## 2021-07-25 MED ORDER — ONDANSETRON HCL 4 MG/2ML IJ SOLN: 4.0000 mg | Freq: Four times a day (QID) | INTRAMUSCULAR | Status: DC | PRN

## 2021-07-25 MED ORDER — MIDAZOLAM HCL 2 MG/2ML IJ SOLN
INTRAMUSCULAR | Status: AC
  Administered 2021-07-25: 2 mg
  Filled 2021-07-25: qty 2

## 2021-07-25 MED ORDER — CEFAZOLIN IN SODIUM CHLORIDE 3-0.9 GM/100ML-% IV SOLN
3.0000 g | INTRAVENOUS | Status: AC
  Administered 2021-07-25: 3 g via INTRAVENOUS
  Filled 2021-07-25: qty 100

## 2021-07-25 MED ORDER — DEXAMETHASONE SODIUM PHOSPHATE 10 MG/ML IJ SOLN
INTRAMUSCULAR | Status: DC | PRN
  Administered 2021-07-25: 10 mg via INTRAVENOUS

## 2021-07-25 MED ORDER — ONDANSETRON HCL 4 MG/2ML IJ SOLN
INTRAMUSCULAR | Status: DC | PRN
  Administered 2021-07-25: 4 mg via INTRAVENOUS

## 2021-07-25 MED ORDER — SODIUM CHLORIDE 0.9 % IR SOLN
Status: DC | PRN
  Administered 2021-07-25: 1000 mL

## 2021-07-25 MED ORDER — ASPIRIN EC 81 MG PO TBEC
81.0000 mg | DELAYED_RELEASE_TABLET | Freq: Every day | ORAL | Status: DC
  Administered 2021-07-25 – 2021-07-28 (×4): 81 mg via ORAL
  Filled 2021-07-25 (×4): qty 1

## 2021-07-25 MED ORDER — ROPIVACAINE HCL 5 MG/ML IJ SOLN
INTRAMUSCULAR | Status: DC | PRN
  Administered 2021-07-25: 25 mL via PERINEURAL

## 2021-07-25 MED ORDER — FENTANYL CITRATE (PF) 250 MCG/5ML IJ SOLN
INTRAMUSCULAR | Status: AC
Start: 2021-07-25 — End: ?
  Filled 2021-07-25: qty 5

## 2021-07-25 MED ORDER — GLYCOPYRROLATE 0.2 MG/ML IJ SOLN
INTRAMUSCULAR | Status: DC | PRN
  Administered 2021-07-25: .2 mg via INTRAVENOUS

## 2021-07-25 MED ORDER — DOCUSATE SODIUM 100 MG PO CAPS
100.0000 mg | ORAL_CAPSULE | Freq: Two times a day (BID) | ORAL | Status: DC
  Administered 2021-07-25 – 2021-07-26 (×3): 100 mg via ORAL
  Filled 2021-07-25 (×5): qty 1

## 2021-07-25 MED ORDER — PROPOFOL 10 MG/ML IV BOLUS
INTRAVENOUS | Status: DC | PRN
  Administered 2021-07-25: 170 mg via INTRAVENOUS

## 2021-07-25 MED ORDER — METOCLOPRAMIDE HCL 5 MG/ML IJ SOLN: 5.0000 mg | Freq: Three times a day (TID) | INTRAMUSCULAR | Status: DC | PRN

## 2021-07-25 MED ORDER — FENTANYL CITRATE PF 50 MCG/ML IJ SOSY
PREFILLED_SYRINGE | INTRAMUSCULAR | Status: AC
  Filled 2021-07-25: qty 3

## 2021-07-25 MED ORDER — FENTANYL CITRATE PF 50 MCG/ML IJ SOSY
PREFILLED_SYRINGE | INTRAMUSCULAR | Status: AC
  Administered 2021-07-25: 100 ug
  Filled 2021-07-25: qty 2

## 2021-07-25 MED ORDER — FENTANYL CITRATE (PF) 100 MCG/2ML IJ SOLN
INTRAMUSCULAR | Status: DC | PRN
Start: 2021-07-25 — End: 2021-07-25
  Administered 2021-07-25 (×2): 50 ug via INTRAVENOUS
  Administered 2021-07-25: 25 ug via INTRAVENOUS

## 2021-07-25 MED ORDER — ROPIVACAINE HCL 5 MG/ML IJ SOLN: INTRAMUSCULAR | Status: DC | PRN

## 2021-07-25 MED ORDER — MIDAZOLAM HCL 5 MG/5ML IJ SOLN
INTRAMUSCULAR | Status: DC | PRN
  Administered 2021-07-25: 2 mg via INTRAVENOUS

## 2021-07-25 MED ORDER — AMISULPRIDE (ANTIEMETIC) 5 MG/2ML IV SOLN: 10.0000 mg | Freq: Once | INTRAVENOUS | Status: DC | PRN

## 2021-07-25 MED ORDER — SODIUM CHLORIDE 0.9 % IR SOLN
Status: DC | PRN
Start: 2021-07-25 — End: 2021-07-25
  Administered 2021-07-25: 6000 mL

## 2021-07-25 MED ORDER — ACETAMINOPHEN 325 MG PO TABS
325.0000 mg | ORAL_TABLET | Freq: Four times a day (QID) | ORAL | Status: DC | PRN
  Administered 2021-07-26: 650 mg via ORAL
  Filled 2021-07-25: qty 2

## 2021-07-25 MED ORDER — DEXMEDETOMIDINE (PRECEDEX) IN NS 20 MCG/5ML (4 MCG/ML) IV SYRINGE
PREFILLED_SYRINGE | INTRAVENOUS | Status: AC
  Filled 2021-07-25: qty 5

## 2021-07-25 MED ORDER — HYDROCODONE-ACETAMINOPHEN 5-325 MG PO TABS
1.0000 | ORAL_TABLET | ORAL | Status: DC | PRN
  Administered 2021-07-26: 2 via ORAL
  Filled 2021-07-25: qty 2

## 2021-07-25 MED ORDER — PHENYLEPHRINE HCL-NACL 20-0.9 MG/250ML-% IV SOLN
INTRAVENOUS | Status: DC | PRN
  Administered 2021-07-25: 50 ug/min via INTRAVENOUS

## 2021-07-25 MED ORDER — BUPIVACAINE HCL (PF) 0.25 % IJ SOLN
INTRAMUSCULAR | Status: AC
  Filled 2021-07-25: qty 30

## 2021-07-25 MED ORDER — CHLORHEXIDINE GLUCONATE 0.12 % MT SOLN
15.0000 mL | Freq: Once | OROMUCOSAL | Status: AC
  Administered 2021-07-25: 15 mL via OROMUCOSAL

## 2021-07-25 MED ORDER — SCOPOLAMINE 1 MG/3DAYS TD PT72
MEDICATED_PATCH | TRANSDERMAL | Status: DC | PRN
  Administered 2021-07-25: 1 via TRANSDERMAL

## 2021-07-25 MED ORDER — SCOPOLAMINE 1 MG/3DAYS TD PT72
MEDICATED_PATCH | TRANSDERMAL | Status: AC
  Filled 2021-07-25: qty 1

## 2021-07-25 MED ORDER — MIDAZOLAM HCL 2 MG/2ML IJ SOLN
INTRAMUSCULAR | Status: AC
Start: 2021-07-25 — End: ?
  Filled 2021-07-25: qty 2

## 2021-07-25 MED ORDER — ORAL CARE MOUTH RINSE: 15.0000 mL | Freq: Once | OROMUCOSAL | Status: AC

## 2021-07-25 MED ORDER — PROPOFOL 10 MG/ML IV BOLUS
INTRAVENOUS | Status: AC
  Filled 2021-07-25: qty 20

## 2021-07-25 MED ORDER — LACTATED RINGERS IV SOLN: INTRAVENOUS | Status: DC

## 2021-07-25 SURGICAL SUPPLY — 80 items
ANCH SUT 2.6 FBRTK 1.7 (Anchor) ×1 IMPLANT
ANCH SUT 2.6 FBRTK 1.7 KNTLS (Anchor) ×1 IMPLANT
ANCHOR FIBERTAK RC 2.6 (BLUE) (Anchor) ×1 IMPLANT
BAG COUNTER SPONGE SURGICOUNT (BAG) ×1 IMPLANT
BAG SPNG CNTER NS LX DISP (BAG) ×1
BANDAGE ESMARK 6X9 LF (GAUZE/BANDAGES/DRESSINGS) IMPLANT
BLADE SHAVER TORPEDO 4X13 (MISCELLANEOUS) ×1 IMPLANT
BLADE SURG 15 STRL LF DISP TIS (BLADE) ×4 IMPLANT
BLADE SURG 15 STRL SS (BLADE) ×4
BNDG CMPR 9X6 STRL LF SNTH (GAUZE/BANDAGES/DRESSINGS)
BNDG COHESIVE 6X5 TAN ST LF (GAUZE/BANDAGES/DRESSINGS) ×2 IMPLANT
BNDG ELASTIC 6X5.8 VLCR STR LF (GAUZE/BANDAGES/DRESSINGS) ×3 IMPLANT
BNDG ESMARK 6X9 LF (GAUZE/BANDAGES/DRESSINGS)
COVER MAYO STAND STRL (DRAPES) ×3 IMPLANT
CUFF TOURN SGL QUICK 34 (TOURNIQUET CUFF) ×2
CUFF TRNQT CYL 34X4.125X (TOURNIQUET CUFF) ×2 IMPLANT
DISPOSABLE INSTRUMENTS KIT FOR TENODESIS SCREW (Orthopedic Implant) ×1 IMPLANT
DISSECTOR  3.8MM X 13CM (MISCELLANEOUS)
DISSECTOR 3.8MM X 13CM (MISCELLANEOUS) IMPLANT
DRAPE ARTHROSCOPY W/POUCH 114 (DRAPES) ×3 IMPLANT
DRAPE C-ARM 42X120 X-RAY (DRAPES) ×3 IMPLANT
DRAPE INCISE IOBAN 66X45 STRL (DRAPES) IMPLANT
DRAPE U-SHAPE 47X51 STRL (DRAPES) ×3 IMPLANT
DRSG PAD ABDOMINAL 8X10 ST (GAUZE/BANDAGES/DRESSINGS) ×4 IMPLANT
DURAPREP 26ML APPLICATOR (WOUND CARE) ×3 IMPLANT
ELECT PENCIL ROCKER SW 15FT (MISCELLANEOUS) ×1 IMPLANT
ELECT REM PT RETURN 15FT ADLT (MISCELLANEOUS) ×3 IMPLANT
FHL IMPLANT SYSTEM 6.25 (Anchor) ×2 IMPLANT
GAUZE 4X4 16PLY ~~LOC~~+RFID DBL (SPONGE) ×3 IMPLANT
GAUZE SPONGE 4X4 12PLY STRL (GAUZE/BANDAGES/DRESSINGS) ×3 IMPLANT
GAUZE XEROFORM 1X8 LF (GAUZE/BANDAGES/DRESSINGS) ×3 IMPLANT
GLOVE BIO SURGEON STRL SZ7.5 (GLOVE) ×6 IMPLANT
GLOVE BIOGEL PI IND STRL 8 (GLOVE) ×4 IMPLANT
GLOVE BIOGEL PI INDICATOR 8 (GLOVE) ×2
GOWN STRL REUS W/ TWL XL LVL3 (GOWN DISPOSABLE) ×4 IMPLANT
GOWN STRL REUS W/TWL XL LVL3 (GOWN DISPOSABLE) ×4
GRAFT TISS SEMITEND 4-8 (Bone Implant) IMPLANT
IMMOBILIZER KNEE 22 UNIV (SOFTGOODS) IMPLANT
IMPL FIBERTAK KNTLS 2.6 (Anchor) IMPLANT
IMPLANT FIBERTAK KNTLS 2.6 (Anchor) ×2 IMPLANT
IV NS IRRIG 3000ML ARTHROMATIC (IV SOLUTION) ×6 IMPLANT
KIT BASIN OR (CUSTOM PROCEDURE TRAY) ×3 IMPLANT
KIT BIO-TENODESIS 3X8 DISP (MISCELLANEOUS) ×2
KIT INSRT BABSR STRL DISP BTN (MISCELLANEOUS) IMPLANT
KIT TURNOVER KIT A (KITS) ×3 IMPLANT
MANIFOLD NEPTUNE II (INSTRUMENTS) ×3 IMPLANT
NDL MAYO CATGUT SZ4 TPR NDL (NEEDLE) ×2 IMPLANT
NEEDLE MAYO CATGUT SZ4 (NEEDLE) ×2 IMPLANT
NS IRRIG 1000ML POUR BTL (IV SOLUTION) ×3 IMPLANT
PACK ARTHROSCOPY DSU (CUSTOM PROCEDURE TRAY) ×3 IMPLANT
PAD CAST 4YDX4 CTTN HI CHSV (CAST SUPPLIES) IMPLANT
PADDING CAST COTTON 4X4 STRL (CAST SUPPLIES)
PADDING CAST COTTON 6X4 STRL (CAST SUPPLIES) ×1 IMPLANT
PASSER SUT SWANSON 36MM LOOP (INSTRUMENTS) IMPLANT
PENCIL SMOKE EVACUATOR (MISCELLANEOUS) ×3 IMPLANT
SPONGE T-LAP 18X18 ~~LOC~~+RFID (SPONGE) ×2 IMPLANT
SPONGE T-LAP 4X18 ~~LOC~~+RFID (SPONGE) ×2 IMPLANT
STRIP CLOSURE SKIN 1/2X4 (GAUZE/BANDAGES/DRESSINGS) ×4 IMPLANT
SUCTION FRAZIER HANDLE 10FR (MISCELLANEOUS) ×2
SUCTION TUBE FRAZIER 10FR DISP (MISCELLANEOUS) ×2 IMPLANT
SUT 2 FIBERLOOP 20 STRT BLUE (SUTURE)
SUT FIBERWIRE #2 38 REV NDL BL (SUTURE)
SUT FIBERWIRE #2 38 T-5 BLUE (SUTURE)
SUT MNCRL AB 3-0 PS2 18 (SUTURE) ×1 IMPLANT
SUT MON AB 3-0 SH 27 (SUTURE) ×2
SUT MON AB 3-0 SH27 (SUTURE) ×2 IMPLANT
SUT VIC AB 0 CT2 27 (SUTURE) IMPLANT
SUT VIC AB 2-0 CT1 27 (SUTURE) ×4
SUT VIC AB 2-0 CT1 TAPERPNT 27 (SUTURE) IMPLANT
SUT VIC AB 2-0 CT2 27 (SUTURE) IMPLANT
SUTURE 2 FIBERLOOP 20 STRT BLU (SUTURE) IMPLANT
SUTURE FIBERWR #2 38 T-5 BLUE (SUTURE) IMPLANT
SUTURE FIBERWR#2 38 REV NDL BL (SUTURE) IMPLANT
SYR BULB IRRIG 60ML STRL (SYRINGE) ×1 IMPLANT
SYSTEM IMPLANT FHL 6.25 (Anchor) IMPLANT
TENDON SEMI-TENDINOSUS (Bone Implant) ×2 IMPLANT
TOWEL OR 17X26 10 PK STRL BLUE (TOWEL DISPOSABLE) ×3 IMPLANT
TUBING ARTHROSCOPY IRRIG 16FT (MISCELLANEOUS) ×3 IMPLANT
TUBING CONNECTING 10 (TUBING) ×3 IMPLANT
WATER STERILE IRR 500ML POUR (IV SOLUTION) ×3 IMPLANT

## 2021-07-25 NOTE — Transfer of Care (Signed)
Immediate Anesthesia Transfer of Care Note ? ?Patient: Sarah Ramos ? ?Procedure(s) Performed: KNEE ARTHROSCOPY WITH REMOVAL OF FOREIGN BODY  MEDIAL PATELLAR FEMORAL LIGAMENT RECONSTRUCTION WITH ALLOGRAFT (Left: Knee) ? ?Patient Location: PACU ? ?Anesthesia Type:General ? ?Level of Consciousness: awake and alert  ? ?Airway & Oxygen Therapy: Patient Spontanous Breathing and Patient connected to face mask oxygen ? ?Post-op Assessment: Report given to RN and Post -op Vital signs reviewed and stable ? ?Post vital signs: Reviewed and stable ? ?Last Vitals:  ?Vitals Value Taken Time  ?BP 113/87 07/25/21 1730  ?Temp 36.7 ?C 07/25/21 1730  ?Pulse 91 07/25/21 1730  ?Resp 9 07/25/21 1730  ?SpO2 100 % 07/25/21 1730  ?Vitals shown include unvalidated device data. ? ?Last Pain:  ?Vitals:  ? 07/25/21 1500  ?TempSrc:   ?PainSc: 0-No pain  ?   ? ?Patients Stated Pain Goal: 5 (07/25/21 1306) ? ?Complications: No notable events documented. ?

## 2021-07-25 NOTE — Progress Notes (Signed)
Orthopedic Tech Progress Note ?Patient Details:  ?Sarah ANASTAS ?12-21-1965 ?[id number] ?Bledsoe brace has been ordered from [facility]  ?Patient ID: Sarah Ramos, female   DOB: [birth year], 55 y.o.   MRN: [id number] ? ?Sarah Ramos Sarah Ramos Sarah Ramos ?07/25/2021, 4:18 PM ? ?

## 2021-07-25 NOTE — Op Note (Signed)
? ?07/25/2021 ? ?5:18 PM ? ?PATIENT:  Sarah Ramos   ? ?PRE-OPERATIVE DIAGNOSIS: Left recurrent Patellar instability with medial patellofemoral ligament disruption ? ?POST-OPERATIVE DIAGNOSIS:  Same ? ?PROCEDURE:  1. Left Knee arthroscopy with chondroplasty of the patella  ?2.  Left knee arthroscopic loose body removal  ?3.left knee open medial patellofemoral ligament Reconstruction ? ?Implants:  Arthrex 2.6 mm FiberTack anchors x 2 ?Arthrex 6.25 mm biocomposite swivel lock  x 1 ? ?Allograft hamstring graft 5.0 mm x 230 mm ? ?SURGEON:  Yolonda KidaJason Patrick Else Habermann, MD ? ?Assistant:  Dion SaucierKevan McClung, PA-C ? ?Assistant attestation: ? ?PA Sharon SellerMcClung was present for the entire procedure.  Participated in all portions. ? ?Tourniquet: Not used ? ?ANESTHESIA:   General ? ?ESTIMATED BLOOD LOSS: 70 cc ? ?PREOPERATIVE INDICATIONS:  Sarah Fischerrin G Riggle is a  56 y.o. female with a diagnosis of patellar dislocation and medial patellofemoral ligament disruption who failed conservative measures and elected for surgical management.   ? ?The risks benefits and alternatives were discussed with the patient preoperatively including but not limited to the risks of infection, bleeding, nerve injury, cardiopulmonary complications, the need for revision surgery, stiffness, posttraumatic arthritis, among others, and the patient was willing to proceed. ? ?OPERATIVE FINDINGS: There was grade 2 chondromalacia undersurface of the patella. The femoral trochlea was extremely shallow. The medial and lateral compartments were normal and there were no meniscal tears. The anterior cruciate ligament and PCL were intact. She had substantial patellar subluxation and tilt prior to repair. Postoperatively She had appropriate tracking, despite the shallow trochlea, and She tracked centrally.  There was however noted to be a large loose body in the medial aspect of the lateral compartment adjacent to the posterior root of the lateral meniscus.  This was greater than 1  cm in length. ? ?OPERATIVE PROCEDURE: The patient was brought to the operating room placed in the supine position. IV antibiotics were given. General anesthesia was administered. The lower extremity was prepped and draped in usual sterile fashion. Time out was performed. The leg was elevated exsanguinated and tourniquet was inflated. Diagnostic arthroscopy was carried out with the above-named findings. I used an arthroscopic shaver as well as an arthroscopic grasper to perform a chondroplasty of the undersurface of the patella. I switched portals to evaluate the tracking of the patella viewing from the medial portal, and also completed my chondroplasty this way. ? ?We then removed the loose body from the lateral compartment with a combination of arthroscopic shaver and a grasping forcep to remove this piece.  It measured greater than 1.0 cm in the longest axis. ? ?I then removed the arthroscopic instruments, and made an incision proximal to the patella down to the proximal one third of the patella through the skin. I then elevated the fascial layer of the quadriceps investment, and mobilized this. I then made an incision through the deep capsular layer including the MPL.  I next established to point of fixation form of a ligament reconstruction.  The superior site was determined proximally 2 cm inferior to the superior pole of the patella.  This would allow for adequate bone to place the anchor.  Another site for the second anchor was established 15 mm inferior to the first anchor.  2 drill pins were placed in the predetermined locations for the anchors.  These were placed in the anterior half of the patella.  Care was taken to avoid penetration of the cartilage surface.  After the guide pins were drilled in a  parallel fashion 15 mm apart, we then overdrilled with the cannulated reamer to a depth of 20 mm.  Next, the hamstring allograft that had been previously prepared via whipstitch of 20 mm on the inside of the  graft was brought into the operative field.  The fiber tack anchors were placed in the drill holes.  We then utilized an onlay strategy in a knotless technique to secure the graft to the patella.  Unfortunately, the distal/inferior of the 2 did lose its purchase in the bone.  However after checking the integrity of the proximal we felt that this was extremely adequate with the loop and tack technique around the graft with excellent security to the medial face of the patella.  I next tested the integrity of that purchase by manually pulling on the graft and it did have excellent purchase.   ? ?We next moved to the femoral sided fixation.  We established a layer to pass our graft between the vastus medialis oblique is in the capsule of the knee.  This would ensure that our ligament was passed in an extra capsular fashion.  Once that plane was established with tonsil dissection we then used fluoroscopy to locate the femoral attachment of the medial patellofemoral ligament.  This was located by finding the sulcus between the abductor tubercle and the medial epicondyle.  On radiographic imaging, utilizing the perfect lateral of the distal femur, we found the point just distal to the posterior aspect of the medial femoral condyle, and just anterior to the continuation of the posterior femoral cortex.  Once that point was confirmed on lateral view we then drilled the guidepin for the femoral anchor.  This guidepin was drilled across the femur and exited the lateral cortex and lateral skin.  A nitinol wire was placed adjacent to that pin.  Next, we whipstitched the 2 free ends of our graft into 1 of approximately 3 cm in the confluence of those graft ends.  This was to protect the graft as we advanced into the socket with her interference screw.  We next utilized the reamer to open the femoral socket to receive the graft and biocomposite tenodesis screw.  Now utilizing a passing suture through the previous established layer  just deep to the VMO we passed the allograft.  The allograft was then pulled into the femoral socket utilizing the previously drilled Beath pin.  Care was taken not to over tension the patella and the knee was flexed to approximately 30?Marland Kitchen  In this position the lateral aspect of the patella was ensured to be at the lateral aspect of the trochlea.  Once that was confirmed we then advanced to the 6.25 mm interference screw into the procedural femoral socket.  This had excellent purchase.  The patella was then reexamined through dynamic examination and was found to not be able to dislocate as it had previously been. ? ?Finally, we utilized the free sutures from the patellar anchors to close the capsular layer over the medial border of the patella.  This was done in a horizontal mattress fashion and in a pants over vest manner. I then repaired the layer in involving the vastus medialis, using a pants over vest repair with 0 Vicryl. This provided excellent augmentation to the repair. I then inserted the scope through the medial portal again, and confirm that I had appropriate translation and correction of patellar tilt.   ? ?After irrigating was copiously and repaired the tissue with Vicryl, 2-0 Monocryl for the subcutaneous  layer, and 3-0 Monocryl for a subcuticular running closure of the skin.  The skin with Steri-Strips and sterile gauze. She received a postoperative block. The incisions were injected with quarter percent plain Marcaine.  Sterile dressings were applied. The tourniquet was released after 54 minutes.  She was awakened and returned to the PACU in stable and satisfactory condition. There were no complications. ? ?Disposition: ? ?The patient will be partial weightbearing to the operative extremity until she is cleared by physical therapy for full weightbearing once her quadriceps has returned to normal function.  The operative extremity will be locked in full extension in a hinged knee brace.  They will  begin physical therapy in 1 week.  They will take twice daily aspirin for 6 weeks for prevention of blood clots.  I will see them back in the office in 2 weeks for wound check.  She will be admitted into

## 2021-07-25 NOTE — Anesthesia Procedure Notes (Signed)
Anesthesia Regional Block: Femoral nerve block  ? ?Pre-Anesthetic Checklist: , timeout performed,  Correct Patient, Correct Site, Correct Laterality,  Correct Procedure,, site marked,  Risks and benefits discussed,  Surgical consent,  Pre-op evaluation,  At surgeon's request and post-op pain management ? ?Laterality: Left ? ?Prep: chloraprep     ?  ?Needles:  ?Injection technique: Single-shot ? ?Needle Type: Other   ? ? ?Needle Length: 9cm  ?Needle Gauge: 20  ? ? ? ?Additional Needles: ? ? ?Procedures:, nerve stimulator,,, ultrasound used (permanent image in chart),,    ?Narrative:  ?Start time: 07/25/2021 2:38 PM ?End time: 07/25/2021 2:48 PM ?Injection made incrementally with aspirations every 5 mL. ? ?Performed by: Personally  ?Anesthesiologist: Lillia Abed, MD ? ?Additional Notes: ?Monitors applied. Patient sedated. Sterile prep and drape,hand hygiene and sterile gloves were used. Relevant anatomy identified.Needle position confirmed.Local anesthetic injected incrementally after negative aspiration. Local anesthetic spread visualized around nerve(s). Vascular puncture avoided. No complications. Image printed for medical record.The patient tolerated the procedure well. ? ? ?   ? ? ? ? ? ?

## 2021-07-25 NOTE — H&P (Signed)
? ?ORTHOPAEDIC CONSULTATION ? ?REQUESTING PHYSICIAN: Nicholes Stairs, MD ? ?PCP:  Scifres, Dorothy, PA-C ? ?Chief Complaint: H and P ? ?HPI: ?Sarah Ramos is a 56 y.o. female who complains of Left patellofemoral joint instability.  She is here today for reconstruction of the medial patellofemoral ligament.  No new complaints today.  Continues with some pain.  She has been wearing her knee immobilizer for the last few weeks to prevent subsequent instability. ? ?Past Medical History:  ?Diagnosis Date  ? Arthritis   ? Chronic fatigue   ? CIN I (cervical intraepithelial neoplasia I) 03/28/2003  ? Colitis   ? Fibromyalgia   ? GERD (gastroesophageal reflux disease)   ? Headache   ? Skin rash   ? Ulcerative colitis ([place])   ? ?Past Surgical History:  ?Procedure Laterality Date  ? CERVICAL DISC SURGERY    ? CHOLECYSTECTOMY    ? GYNECOLOGIC CRYOSURGERY  1991  ? CIN 1  ? INTRAUTERINE DEVICE INSERTION  01-2006, 02/2011, 05/05/2016  ? Mirena  ? ?Social History  ? ?Socioeconomic History  ? Marital status: Divorced  ?  Spouse name: Not on file  ? Number of children: Not on file  ? Years of education: Not on file  ? Highest education level: Not on file  ?Occupational History  ? Not on file  ?Tobacco Use  ? Smoking status: Never  ? Smokeless tobacco: Never  ?Vaping Use  ? Vaping Use: Never used  ?Substance and Sexual Activity  ? Alcohol use: Yes  ?  Alcohol/week: 1.0 standard drink  ?  Types: 1 Standard drinks or equivalent per week  ?  Comment: Social  ? Drug use: No  ? Sexual activity: Yes  ?  Birth control/protection: I.U.D.  ?  Comment: MIRENA inserted 05/05/2016-1st intercourse 56 yo-More than 5 partners  ?Other Topics Concern  ? Not on file  ?Social History Narrative  ? Not on file  ? ?Social Determinants of Health  ? ?Financial Resource Strain: Not on file  ?Food Insecurity: Not on file  ?Transportation Needs: Not on file  ?Physical Activity: Not on file  ?Stress: Not on file  ?Social Connections: Not on file   ? ?Family History  ?Problem Relation Age of Onset  ? Breast cancer Mother 33  ? Cancer Mother   ?     Bone cancer  ? Heart disease Paternal Grandmother   ? Heart disease Paternal Grandfather   ? ?Allergies  ?Allergen Reactions  ? Oyster Extract Nausea And Vomiting  ? Codeine Nausea And Vomiting  ? Latex Rash  ? Tetracyclines & Related Nausea And Vomiting  ? Topamax Nausea And Vomiting  ? ?Prior to Admission medications   ?Medication Sig Start Date End Date Taking? Authorizing Provider  ?aspirin EC 81 MG tablet Take 81 mg by mouth daily.   Yes [provider]  ?Cholecalciferol (VITAMIN D PO) Take 1,000 mg by mouth daily.   Yes [provider]  ?COLLAGEN PO Take 2 Scoops by mouth in the morning.   Yes [provider]  ?Cyanocobalamin (VITAMIN B 12 PO) Take 1 tablet by mouth daily.   Yes [provider]  ?Cyclobenzaprine HCl (FLEXERIL PO) Take 5 mg by mouth daily as needed.   Yes [provider]  ?furosemide (LASIX) 20 MG tablet TK 1 T PO ONCE A DAY 09/30/16  Yes [provider]  ?Glucosamine HCl (GLUCOSAMINE PO) Take 2 tablets by mouth daily. fibromyalgia   Yes [provider]  ?inFLIXimab (  REMICADE) 100 MG injection Inject into the vein See admin instructions. Every eight weeks   Yes [provider]  ?Multiple Vitamin (MULTIVITAMIN) capsule Take 1 capsule by mouth daily.     Yes [provider]  ?traMADol (ULTRAM) 50 MG tablet Take 50 mg by mouth at bedtime as needed for moderate pain or severe pain.   Yes [provider]  ?diclofenac sodium (VOLTAREN) 1 % GEL Voltaren Gel 3 grams to 3 large joints upto TID 5 TUBES with 3 refills ?Patient not taking: Reported on 11/25/2020 12/28/16   Eliezer Lofts, [place]  ? ?No results found. ? ?Positive ROS: All other systems have been reviewed and were otherwise negative with the exception of those mentioned in the HPI and as above. ? ?Physical Exam: ?General: Alert, no acute  distress ?Cardiovascular: No pedal edema ?Respiratory: No cyanosis, no use of accessory musculature ?GI: No organomegaly, abdomen is soft and non-tender ?Skin: No lesions in the area of chief complaint ?Neurologic: Sensation intact distally ?Psychiatric: Patient is competent for consent with normal mood and affect ?Lymphatic: No axillary or cervical lymphadenopathy ? ?MUSCULOSKELETAL:  ?Left lower extremity is warm well perfused no open wounds or lesions. ? ?Assessment: ?1.  Left knee patellofemoral instability ?2.  Left knee chondromalacia of the patellofemoral joint ? ?Plan: ?-Plan to proceed today with diagnostic arthroscopy and reconstruction and a mini open fashion of the medial patellofemoral ligament with allograft.  No new complaints today.  We again reviewed the risk of bleeding, infection, damage to surrounding nerves and vessels, subsequent dislocation, arthrosis, graft failure, DVT, stiffness, the risk of anesthesia.  She has provided informed consent. ? ?-We will plan for admission postoperatively for close physical therapy evaluation and treatment recommendations to help with disposition planning. ? ? ? ?Nicholes Stairs, MD ?Cell [phone]  ? ? ?07/25/2021 ?3:27 PM ? ?

## 2021-07-25 NOTE — Anesthesia Preprocedure Evaluation (Addendum)
Anesthesia Evaluation  ?Patient identified by MRN, date of birth, ID band ?Patient awake ? ? ? ?Reviewed: ?Allergy & Precautions, NPO status , Patient's Chart, lab work & pertinent test results ? ?History of Anesthesia Complications ?Negative for: history of anesthetic complications ? ?Airway ?Mallampati: III ? ?TM Distance: >3 FB ?Neck ROM: Full ? ? ? Dental ?no notable dental hx. ?(+) Dental Advisory Given ?  ?Pulmonary ?neg pulmonary ROS,  ?  ?Pulmonary exam normal ? ? ? ? ? ? ? Cardiovascular ?negative cardio ROS ?Normal cardiovascular exam ? ? ?  ?Neuro/Psych ?negative neurological ROS ?   ? GI/Hepatic ?Neg liver ROS, GERD  ,Ulcerative colitis ?  ?Endo/Other  ?Morbid obesity ? Renal/GU ?negative Renal ROS  ? ?  ?Musculoskeletal ? ?(+) Arthritis ,  ? Abdominal ?  ?Peds ? Hematology ?negative hematology ROS ?(+)   ?Anesthesia Other Findings ? ? Reproductive/Obstetrics ? ?  ? ? ? ? ? ? ? ? ? ? ? ? ? ?  ?  ? ? ? ? ? ? ? ?Anesthesia Physical ?Anesthesia Plan ? ?ASA: 3 ? ?Anesthesia Plan: General  ? ?Post-op Pain Management: Regional block*, Tylenol PO (pre-op)* and Celebrex PO (pre-op)*  ? ?Induction: Intravenous ? ?PONV Risk Score and Plan: 4 or greater and Ondansetron, Dexamethasone, Midazolam and Scopolamine patch - Pre-op ? ?Airway Management Planned: LMA ? ?Additional Equipment:  ? ?Intra-op Plan:  ? ?Post-operative Plan: Extubation in OR ? ?Informed Consent: I have reviewed the patients History and Physical, chart, labs and discussed the procedure including the risks, benefits and alternatives for the proposed anesthesia with the patient or authorized representative who has indicated his/her understanding and acceptance.  ? ? ? ?Dental advisory given ? ?Plan Discussed with: Anesthesiologist and CRNA ? ?Anesthesia Plan Comments:   ? ? ? ? ? ?Anesthesia Quick Evaluation ? ?

## 2021-07-25 NOTE — Progress Notes (Signed)
Assisted Dr. Singer with left, femoral, ultrasound guided block. Side rails up, monitors on throughout procedure. See vital signs in flow sheet. Tolerated Procedure well. 

## 2021-07-25 NOTE — Discharge Instructions (Addendum)
DISCHARGE INSTRUCTIONS: ?________________________________________________________________________________ ?Knee ligament RECONSTRUCTION HOME EXERCISE PROGRAM (0-2 WEEKS) ? ? ?Elevate the leg above your heart as often as possible. ?Weight bear as tolerated with the immobilizer. ?Wear immobilizer all the time except when exercising.  Wear immobilizer at night!! ?Start normal showering according to your surgeon?s instructions, starting on POD #3 ?Use pain medication as needed.  To prevent constipation use Colace 100mg . twice a day while on pain medication.  If constipated, use Miralax 17 gm once a day and drink plenty of fluids.  These medications can be obtained at the pharmacy without a prescription.   ?For mild to moderate pain use Tylenol and Advil in alternating fashion around-the-clock.  For breakthrough complaint pain use the prescription strength Percocet.  You should also apply ice to the knee liberally for 20 to 30 minutes out of each hour. ?Follow up in the office in 14 days. ?You can remove the dressing 5 days after surgery.  Leave the Steri-Strips in place.  You can bathe but no soaking or submerging of the incision. ?Take aspirin 81 mg daily for for blood clot prevention for 6 weeks. ? ?

## 2021-07-25 NOTE — Anesthesia Postprocedure Evaluation (Signed)
Anesthesia Post Note ? ?Patient: Sarah Ramos ? ?Procedure(s) Performed: KNEE ARTHROSCOPY WITH REMOVAL OF FOREIGN BODY  MEDIAL PATELLAR FEMORAL LIGAMENT RECONSTRUCTION WITH ALLOGRAFT (Left: Knee) ? ?  ? ?Patient location during evaluation: PACU ?Anesthesia Type: General ?Level of consciousness: sedated ?Pain management: pain level controlled ?Vital Signs Assessment: post-procedure vital signs reviewed and stable ?Respiratory status: spontaneous breathing and respiratory function stable ?Cardiovascular status: stable ?Postop Assessment: no apparent nausea or vomiting ?Anesthetic complications: no ? ? ?No notable events documented. ? ?Last Vitals:  ?Vitals:  ? 07/25/21 1800 07/25/21 1815  ?BP: 137/84 (!) 150/94  ?Pulse: 83 76  ?Resp: 11 13  ?Temp:    ?SpO2: 100% 99%  ?  ?Last Pain:  ?Vitals:  ? 07/25/21 1745  ?TempSrc:   ?PainSc: 0-No pain  ? ? ?  ?  ?  ?  ?  ?  ? ?Ivanna Kocak DANIEL ? ? ? ? ?

## 2021-07-25 NOTE — Anesthesia Procedure Notes (Signed)
Procedure Name: LMA Insertion ?Date/Time: 07/25/2021 3:40 PM ?Performed by: Heather Roberts, MD ?Pre-anesthesia Checklist: Patient identified, Emergency Drugs available, Suction available, Patient being monitored and Timeout performed ?Patient Re-evaluated:Patient Re-evaluated prior to induction ?Oxygen Delivery Method: Circle system utilized ?Preoxygenation: Pre-oxygenation with 100% oxygen ?Induction Type: IV induction ?LMA: LMA inserted ?LMA Size: 4.0 ?Number of attempts: 1 ?Comments: LMA inserted by SRNA-- assisted and supervised by Krista Blue- atraumatic ? ? ? ? ?

## 2021-07-25 NOTE — Anesthesia Procedure Notes (Signed)
Date/Time: 07/25/2021 5:17 PM ?Performed by: Cynda Familia, CRNA ?Oxygen Delivery Method: Simple face mask ?Placement Confirmation: positive ETCO2 and breath sounds checked- equal and bilateral ?Dental Injury: Teeth and Oropharynx as per pre-operative assessment  ? ? ? ? ?

## 2021-07-25 NOTE — Brief Op Note (Signed)
07/25/2021 ? ?5:05 PM ? ?PATIENT:  Sarah Ramos  56 y.o. female ? ?PRE-OPERATIVE DIAGNOSIS:  Left knee patella instability ? ?POST-OPERATIVE DIAGNOSIS:  Left knee patella instability ? ?PROCEDURE:  Procedure(s) with comments: ?Left KNEE ARTHROSCOPY WITH Loose body removal ?Left knee MEDIAL PATELLAR FEMORAL LIGAMENT RECONSTRUCTION WITH ALLOGRAFT (Left) - 120 ? ?SURGEON:  Surgeon(s) and Role: ?   Stann Mainland, Elly Modena, MD - Primary ? ?PHYSICIAN ASSISTANT: Jonelle Sidle, PA-C ? ?ANESTHESIA:   regional and general ? ?EBL:  70 mL  ? ?BLOOD ADMINISTERED:none ? ?DRAINS: none  ? ?LOCAL MEDICATIONS USED:  NONE ? ?SPECIMEN:  No Specimen ? ?DISPOSITION OF SPECIMEN:  N/A ? ?COUNTS:  YES ? ?TOURNIQUET:  * Missing tourniquet times found for documented tourniquets in log: [id number] * ? ?DICTATION: .Note written in EPIC ? ?PLAN OF CARE: Admit for overnight observation ? ?PATIENT DISPOSITION:  PACU - hemodynamically stable. ?  ?Delay start of Pharmacological VTE agent (>24hrs) due to surgical blood loss or risk of bleeding: not applicable ? ?

## 2021-07-26 ENCOUNTER — Encounter (HOSPITAL_COMMUNITY): Payer: Self-pay | Admitting: Orthopedic Surgery

## 2021-07-26 ENCOUNTER — Other Ambulatory Visit: Payer: Self-pay

## 2021-07-26 DIAGNOSIS — Z8249 Family history of ischemic heart disease and other diseases of the circulatory system: Secondary | ICD-10-CM | POA: Diagnosis not present

## 2021-07-26 DIAGNOSIS — M94262 Chondromalacia, left knee: Secondary | ICD-10-CM | POA: Diagnosis present

## 2021-07-26 DIAGNOSIS — Z803 Family history of malignant neoplasm of breast: Secondary | ICD-10-CM | POA: Diagnosis not present

## 2021-07-26 DIAGNOSIS — K219 Gastro-esophageal reflux disease without esophagitis: Secondary | ICD-10-CM | POA: Diagnosis present

## 2021-07-26 DIAGNOSIS — M2352 Chronic instability of knee, left knee: Secondary | ICD-10-CM | POA: Diagnosis present

## 2021-07-26 DIAGNOSIS — Z91040 Latex allergy status: Secondary | ICD-10-CM | POA: Diagnosis not present

## 2021-07-26 DIAGNOSIS — Z79899 Other long term (current) drug therapy: Secondary | ICD-10-CM | POA: Diagnosis not present

## 2021-07-26 DIAGNOSIS — Z888 Allergy status to other drugs, medicaments and biological substances status: Secondary | ICD-10-CM | POA: Diagnosis not present

## 2021-07-26 DIAGNOSIS — Z6841 Body Mass Index (BMI) 40.0 and over, adult: Secondary | ICD-10-CM | POA: Diagnosis not present

## 2021-07-26 DIAGNOSIS — Z7982 Long term (current) use of aspirin: Secondary | ICD-10-CM | POA: Diagnosis not present

## 2021-07-26 DIAGNOSIS — M797 Fibromyalgia: Secondary | ICD-10-CM | POA: Diagnosis present

## 2021-07-26 DIAGNOSIS — Z87410 Personal history of cervical dysplasia: Secondary | ICD-10-CM | POA: Diagnosis not present

## 2021-07-26 DIAGNOSIS — Z885 Allergy status to narcotic agent status: Secondary | ICD-10-CM | POA: Diagnosis not present

## 2021-07-26 DIAGNOSIS — Z9889 Other specified postprocedural states: Secondary | ICD-10-CM

## 2021-07-26 MED ORDER — OXYCODONE HCL 5 MG PO TABS
5.0000 mg | ORAL_TABLET | ORAL | Status: DC | PRN
  Administered 2021-07-26 – 2021-07-27 (×3): 5 mg via ORAL
  Filled 2021-07-26 (×3): qty 1

## 2021-07-26 NOTE — [place] FL2 (Signed)
?[place] MEDICAID FL2 LEVEL OF CARE SCREENING TOOL  ?  ? ?IDENTIFICATION  ?Patient Name: ?Sarah Ramos Birthdate: 02/19/1966 Sex: female Admission Date (Current Location): ?07/25/2021  ?[place] Number: ? [place] ?  Facility and Address:  ?[facility],  [address], [place] [address] ?     Provider Number: ?[id number]  ?Attending Physician Name and Address:  ?Yolonda Kida, MD ? Relative Name and Phone Number:  ?Maudry Mayhew (mother) Ph: [phone] ?   ?Current Level of Care: ?Hospital Recommended Level of Care: ?Skilled Nursing Facility Prior Approval Number: ?  ? ?Date Approved/Denied: ?  PASRR Number: ?[id number] A ? ?Discharge Plan: ?SNF ?  ? ?Current Diagnoses: ?Patient Active Problem List  ? Diagnosis Date Noted  ? Patellar instability of left knee 07/25/2021  ? Fatigue 01/20/2016  ? Insomnia 01/20/2016  ? Trapezius muscle spasm 01/20/2016  ? Fibromyalgia 07/03/2012  ? Colitis   ? ? ?Orientation RESPIRATION BLADDER Height & Weight   ?  ?Self, Time, Situation, Place ? Normal Continent Weight: 263 lb 7.2 oz (119.5 kg) ?Height:  5\' 7"  (170.2 cm)  ?BEHAVIORAL SYMPTOMS/MOOD NEUROLOGICAL BOWEL NUTRITION STATUS  ? (N/A)  (N/A) Continent Diet (Regular diet)  ?AMBULATORY STATUS COMMUNICATION OF NEEDS Skin   ?Limited Assist Verbally Surgical wounds ?  ?  ?  ?    ?     ?     ? ? ?Personal Care Assistance Level of Assistance  ?Bathing, Feeding, Dressing Bathing Assistance: Limited assistance ?Feeding assistance: Independent ?Dressing Assistance: Limited assistance ?   ? ?Functional Limitations Info  ?Sight, Hearing, Speech Sight Info: Impaired ?Hearing Info: Adequate ?Speech Info: Adequate  ? ? ?SPECIAL CARE FACTORS FREQUENCY  ?PT (By licensed PT), OT (By licensed OT)   ?  ?PT Frequency: 5x's/week ?OT Frequency: 5x's/week ?  ?  ?  ?   ? ? ?Contractures Contractures Info: Not present  ? ? ?Additional Factors Info  ?Code Status, Allergies Code Status Info: Full ?Allergies Info:  Oyster Extract, Codeine, Latex, Tetracyclines & Related, Topamax ?  ?  ?  ?   ? ?Current Medications (07/26/2021):  This is the current hospital active medication list ?Current Facility-Administered Medications  ?Medication Dose Route Frequency Provider Last Rate Last Admin  ? acetaminophen (TYLENOL) tablet 325-650 mg  325-650 mg Oral Q6H PRN 09/25/2021, MD      ? acetaminophen (TYLENOL) tablet 500 mg  500 mg Oral Q6H Yolonda Kida, MD   500 mg at 07/26/21 1206  ? aspirin EC tablet 81 mg  81 mg Oral Daily 09/25/21, MD   81 mg at 07/26/21 0845  ? docusate sodium (COLACE) capsule 100 mg  100 mg Oral BID 09/25/21, MD   100 mg at 07/26/21 0845  ? enoxaparin (LOVENOX) injection 40 mg  40 mg Subcutaneous Q24H 09/25/21, MD   40 mg at 07/26/21 0845  ? HYDROcodone-acetaminophen (NORCO) 7.5-325 MG per tablet 1-2 tablet  1-2 tablet Oral Q4H PRN 07-25-1976, MD   1 tablet at 07/26/21 0845  ? HYDROcodone-acetaminophen (NORCO/VICODIN) 5-325 MG per tablet 1-2 tablet  1-2 tablet Oral Q4H PRN 09/25/21, MD      ? metoCLOPramide [facility]) tablet 5-10 mg  5-10 mg Oral Q8H PRN [facility], MD      ? Or  ? metoCLOPramide (REGLAN) injection 5-10 mg  5-10 mg Intravenous Q8H PRN 7-10, MD      ? morphine (  PF) 2 MG/ML injection 0.5-1 mg  0.5-1 mg Intravenous Q2H PRN Yolonda Kida, MD      ? ondansetron [facility]) tablet 4 mg  4 mg Oral Q6H PRN Yolonda Kida, MD      ? Or  ? ondansetron [facility]) injection 4 mg  4 mg Intravenous Q6H PRN Yolonda Kida, MD      ? ? ? ?Discharge Medications: ?Please see discharge summary for a list of discharge medications. ? ?Relevant Imaging Results: ? ?Relevant Lab Results: ? ? ?Additional Information ?SNN: [id number] ? ?Ewing Schlein, LCSW ? ? ? ? ?

## 2021-07-26 NOTE — Progress Notes (Signed)
? ?  Subjective: ? ?ARINE LUKS is a 56 y.o. female, 1 Day Post-Op  ?  s/p Procedure(s): ?KNEE ARTHROSCOPY WITH REMOVAL OF FOREIGN BODY  MEDIAL PATELLAR FEMORAL LIGAMENT RECONSTRUCTION WITH ALLOGRAFT ? ? ?Patient reports pain as mild to moderate.  Reports getting to restroom/ out of the bed is very difficult. Still has some numbness at the thigh, foot has normal sensation. No calf pain, SOB, abdominal pain.  ? ?Objective:  ? ?VITALS:   ?Vitals:  ? 07/25/21 1913 07/25/21 2126 07/26/21 0213 07/26/21 0511  ?BP:  138/72 110/61 110/68  ?Pulse:  69 77 75  ?Resp:  18 18 18   ?Temp:  98.3 ?F (36.8 ?C) 98.7 ?F (37.1 ?C) 98.7 ?F (37.1 ?C)  ?TempSrc:  Oral Oral Oral  ?SpO2:  97% 97% 97%  ?Weight: 119.5 kg     ?Height: 5\' 7"  (1.702 m)     ? ? ?Constitutional: General Appearance: healthy-appearing, well-nourished, and well-developed.obese, in hospital bed ?Level of Distress: no acute distress. ?Eyes: Lens (normal) clear: both eyes. ?Head: Head: normocephalic and atraumatic. ?Lungs: Respiratory effort: no dyspnea. ?Skin: Inspection and palpation: no rash, lesions ?Neurologic: Cranial Nerves: grossly intact. Sensation: grossly intact. ?Psychiatric: Insight: good judgement and insight. Mental Status: normal mood and affect and active and alert. Orientation: to time, place, and person. ? ?Left Knee:  ?Neurologically intact ?ABD soft ?Neurovascular intact ?Sensation intact distally ?Intact pulses distally ?Dorsiflexion/Plantar flexion intact ?Incision: dressing C/D/I ?Calf soft, nontender, able to wiggle ankles, toes. Knee immobilizer present ? ?Lab Results  ?Component Value Date  ? WBC 9.6 07/25/2021  ? HGB 13.5 07/25/2021  ? HCT 39.4 07/25/2021  ? MCV 96.1 07/25/2021  ? PLT 247 07/25/2021  ? ?BMET ?   ?Component Value Date/Time  ? NA 139 07/22/2021 0933  ? K 3.8 07/22/2021 0933  ? CL 108 07/22/2021 0933  ? CO2 25 07/22/2021 0933  ? GLUCOSE 100 (H) 07/22/2021 0933  ? BUN 13 07/22/2021 0933  ? CREATININE 0.73 07/25/2021 2023  ?  CREATININE 0.92 11/25/2019 0909  ? CALCIUM 8.9 07/22/2021 0933  ? GFRNONAA >60 07/25/2021 2023  ? GFRNONAA 83 07/18/2016 1406  ? ? ? ?Assessment/Plan: ?1 Day Post-Op  ? ?Principal Problem: ?  Patellar instability of left knee ? ? ?Advance diet ?Up with therapy ? ?We will see how she progresses with therapy over the next few days to determine dispo, she currently in being recommended by PT for SNF pending progress. If we do not see a significant improvement tomorrow then would need to get [facility] team involved for SNF placement. She reports she has a friends house  she could potentially stay with though she still has stairs to get into the house. Earliest potential discharge is likely 3/4 if she does really well.  ? ?Weightbearing Status: Partial while block is active then WBAT once quadriceps function return while in extension in knee immobilizer or TROM either are appropriate ?DVT Prophylaxis: lovenox inpatient, aspirin 81mg  outpatient ? ? ?Dutch Gray Adair Lemar ?07/26/2021, 11:53 AM ? ?Jonelle Sidle PA-C  ?Physician Assistant with Dr. Lillia Abed [place] ? ?

## 2021-07-26 NOTE — Evaluation (Signed)
Physical Therapy Evaluation ?Patient Details ?Name: Sarah Ramos ?MRN: [id number] ?DOB: 08/07/1965 ?Today's Date: 07/26/2021 ? ?History of Present Illness ? 56 yo female s/p left knee open medial patellofemoral ligament Reconstruction, loose body removal, chondroplasty of the patella. PMH: fibromyalgia, chronic fatigue, obesity  ?Clinical Impression ? Pt admitted with above diagnosis.  ?Pt has been at home functioning in [place], mod I with RW and intermittent assist from boyfriend since injury. ?She presents today requiring assist for all basic mobility, requiring incr tim e to complete all tasks.  Pt has multiple steps to enter her home and 20 steps up to bedroom/full bathroom. ?Pt may need SNF pending progress in acute setting. ? ? Pt currently with functional limitations due to the deficits listed below (see PT Problem List). Pt will benefit from skilled PT to increase their independence and safety with mobility to allow discharge to the venue listed below.   ?   ?   ? ?Recommendations for follow up therapy are one component of a multi-disciplinary discharge planning process, led by the attending physician.  Recommendations may be updated based on patient status, additional functional criteria and insurance authorization. ? ?Follow Up Recommendations Skilled nursing-short term rehab (<3 hours/day) ? ?  ?Assistance Recommended at Discharge    ?Patient can return home with the following ? A little help with walking and/or transfers;A little help with bathing/dressing/bathroom;Assistance with cooking/housework;Assist for transportation;Help with stairs or ramp for entrance ? ?  ?Equipment Recommendations BSC/3in1  ?Recommendations for Other Services ?    ?  ?Functional Status Assessment Patient has had a recent decline in their functional status and demonstrates the ability to make significant improvements in function in a reasonable and predictable amount of time.  ? ?  ?Precautions / Restrictions  Precautions ?Precautions: Fall ?Precaution Comments: assume NO ROM L KNEE ?Required Braces or Orthoses: Other Brace ?Other Brace: bledsoe ?Restrictions ?Weight Bearing Restrictions: No ?Other Position/Activity Restrictions: WBAT  ? ?  ? ?Mobility ? Bed Mobility ?Overal bed mobility: Needs Assistance ?Bed Mobility: Supine to Sit ?  ?  ?Supine to sit: Min assist ?  ?  ?General bed mobility comments: instructed in use of gait belt as leg lifter,pt still required assist to progress LLE off bed, requires excessive time and effort ?  ? ?Transfers ?Overall transfer level: Needs assistance ?Equipment used: Rolling walker (2 wheels) ?Transfers: Sit to/from Stand ?Sit to Stand: Min guard ?  ?  ?  ?  ?  ?General transfer comment: cues for hand placement ?  ? ?Ambulation/Gait ?Ambulation/Gait assistance: Min guard, Min assist ?Gait Distance (Feet): 20 Feet (8' more) ?Assistive device: Rolling walker (2 wheels) ?Gait Pattern/deviations: Step-to pattern, Decreased stance time - left ?  ?  ?  ?General Gait Details: verbal cues for sequence, step length and step to gait pattern. pt with notable knee instability, manual facilitation for terminal knee extension ? ?Stairs ?  ?  ?  ?  ?  ? ?Wheelchair Mobility ?  ? ?Modified Rankin (Stroke Patients Only) ?  ? ?  ? ?Balance Overall balance assessment: Needs assistance ?Sitting-balance support: No upper extremity supported, Feet supported ?Sitting balance-Leahy Scale: Fair ?  ?  ?Standing balance support: Reliant on assistive device for balance ?Standing balance-Leahy Scale: Poor ?  ?  ?  ?  ?  ?  ?  ?  ?  ?  ?  ?  ?   ? ? ? ?Pertinent Vitals/Pain Pain Assessment ?Pain Assessment: 0-10 ?Pain Score: 4  ?Pain Location:  L knee ?Pain Descriptors / Indicators: Grimacing, Sore, Aching ?Pain Intervention(s): Limited activity within patient's tolerance, Monitored during session, Premedicated before session, Repositioned, Ice applied  ? ? ?Home Living Family/patient expects to be discharged to::  Private residence ?Living Arrangements: Alone ?Available Help at Discharge: Available PRN/intermittently (boyfriend) ?Type of Home: House ?Home Access: Stairs to enter ?  ?Entrance Stairs-Number of Steps: 7 ?Alternate Level Stairs-Number of Steps: 20 ?Home Layout: Two level;Able to live on main level with bedroom/bathroom ?Home Equipment: Agricultural consultant (2 wheels);Cane - single point ?   ?  ?Prior Function Prior Level of Function : Independent/Modified Independent ?  ?  ?  ?  ?  ?  ?  ?  ?  ? ? ?Hand Dominance  ?   ? ?  ?Extremity/Trunk Assessment  ? Upper Extremity Assessment ?Upper Extremity Assessment: Overall WFL for tasks assessed ?  ? ?Lower Extremity Assessment ?Lower Extremity Assessment: LLE deficits/detail ?LLE Deficits / Details: ankle WFL, assists with SLR with brace intact ?LLE: Unable to fully assess due to immobilization ?  ? ?   ?Communication  ?    ?Cognition Arousal/Alertness: Awake/alert ?Behavior During Therapy: [facility] for tasks assessed/performed ?Overall Cognitive Status: Within Functional Limits for tasks assessed ?  ?  ?  ?  ?  ?  ?  ?  ?  ?  ?  ?  ?  ?  ?  ?  ?  ?  ?  ? ?  ?General Comments   ? ?  ?Exercises General Exercises - Lower Extremity ?Ankle Circles/Pumps: AROM, Both, 10 reps ?Quad Sets: Both, 5 reps, AROM  ? ?Assessment/Plan  ?  ?PT Assessment Patient needs continued PT services  ?PT Problem List Decreased strength;Decreased mobility;Decreased activity tolerance;Decreased knowledge of use of DME;Pain;Decreased knowledge of precautions;Decreased balance ? ?   ?  ?PT Treatment Interventions DME instruction;Therapeutic exercise;Gait training;Functional mobility training;Therapeutic activities;Patient/family education;Stair training   ? ?PT Goals (Current goals can be found in the Care Plan section)  ?Acute Rehab PT Goals ?Patient Stated Goal: be able to move more ?PT Goal Formulation: With patient ?Time For Goal Achievement: 08/09/21 ?Potential to Achieve Goals: Good ? ?  ?Frequency Min  5X/week ?  ? ? ?Co-evaluation   ?  ?  ?  ?  ? ? ?  ?AM-PAC PT "6 Clicks" Mobility  ?Outcome Measure Help needed turning from your back to your side while in a flat bed without using bedrails?: A Little ?Help needed moving from lying on your back to sitting on the side of a flat bed without using bedrails?: A Little ?Help needed moving to and from a bed to a chair (including a wheelchair)?: A Little ?Help needed standing up from a chair using your arms (e.g., wheelchair or bedside chair)?: A Little ?Help needed to walk in hospital room?: A Little ?Help needed climbing 3-5 steps with a railing? : A Lot ?6 Click Score: 17 ? ?  ?End of Session Equipment Utilized During Treatment: Gait belt;Other (comment) (Bledsoe) ?Activity Tolerance: Patient limited by fatigue ?Patient left: in chair;with call bell/phone within reach;with chair alarm set ?Nurse Communication: Mobility status ?PT Visit Diagnosis: Other abnormalities of gait and mobility (R26.89);Difficulty in walking, not elsewhere classified (R26.2) ?  ? ?Time: 8315-1761 ?PT Time Calculation (min) (ACUTE ONLY): 49 min ? ? ?Charges:   PT Evaluation ?$PT Eval Low Complexity: 1 Low ?PT Treatments ?$Gait Training: 8-22 mins ?$Therapeutic Activity: 8-22 mins ?  ?   ? ? ?Delice Bison, PT ? ?Acute Rehab  Dept [facility] [phone] ?Pager [phone] ? ?07/26/2021 ? ? ?Dorrine Montone ?07/26/2021, 10:28 AM ? ?

## 2021-07-26 NOTE — TOC Initial Note (Signed)
Transition of Care (TOC) - Initial/Assessment Note  ? ?Patient Details  ?Name: Sarah Ramos ?MRN: [id number] ?Date of Birth: Sep 08, 1965 ? ?Transition of Care (TOC) CM/SW Contact:    ?Ewing Schlein, LCSW ?Phone Number: ?07/26/2021, 1:45 PM ? ?Clinical Narrative: PT evaluation recommended SNF. CSW spoke with patient and patient is agreeable to referral. Patient is aware that she may get limited or no bed offers due to her insurance. ? ?FL2 done; PASRR received ([id number] A). Initial referral faxed out. TOC awaiting bed offers. ? ?Expected Discharge Plan: Skilled Nursing Facility ?Barriers to Discharge: Continued Medical Work up, SNF Pending bed offer, Insurance Authorization ? ?Patient Goals and CMS Choice ?Patient states their goals for this hospitalization and ongoing recovery are:: Go to short-term rehab and then return home ?CMS Medicare.gov Compare Post Acute Care list provided to:: Patient ?Choice offered to / list presented to : Patient ? ?Expected Discharge Plan and Services ?Expected Discharge Plan: Skilled Nursing Facility ?In-house Referral: Clinical Social Work ?Post Acute Care Choice: Skilled Nursing Facility ?Living arrangements for the past 2 months: Single Family Home           ?DME Arranged: N/A ?DME Agency: NA ? ?Prior Living Arrangements/Services ?Living arrangements for the past 2 months: Single Family Home ?Lives with:: Self ?Patient language and need for interpreter reviewed:: Yes ?Do you feel safe going back to the place where you live?: Yes      ?Need for Family Participation in Patient Care: No (Comment) ?Care giver support system in place?: Yes (comment) ?Criminal Activity/Legal Involvement Pertinent to Current Situation/Hospitalization: No - Comment as needed ? ?Activities of Daily Living ?Home Assistive Devices/Equipment: Cane (specify quad or straight), Walker (specify type) (front wheel walker) ?ADL Screening (condition at time of admission) ?Patient's cognitive ability adequate to  safely complete daily activities?: Yes ?Is the patient deaf or have difficulty hearing?: No ?Does the patient have difficulty seeing, even when wearing glasses/contacts?: No ?Does the patient have difficulty concentrating, remembering, or making decisions?: No ?Patient able to express need for assistance with ADLs?: Yes ?Does the patient have difficulty dressing or bathing?: No ?Independently performs ADLs?: Yes (appropriate for developmental age) ?Does the patient have difficulty walking or climbing stairs?: Yes ?Weakness of Legs: Left ?Weakness of Arms/Hands: None ? ?Permission Sought/Granted ?Permission sought to share information with : Magazine features editor ?Permission granted to share information with : Yes, Verbal Permission Granted ?Permission granted to share info w AGENCY: SNFs ? ?Emotional Assessment ?Attitude/Demeanor/Rapport: Engaged ?Affect (typically observed): Accepting ?Orientation: : Oriented to Self, Oriented to Place, Oriented to  Time, Oriented to Situation ?Alcohol / Substance Use: Not Applicable ?Psych Involvement: No (comment) ? ?Admission diagnosis:  Patellar instability of left knee [M25.362] ?Patient Active Problem List  ? Diagnosis Date Noted  ? Patellar instability of left knee 07/25/2021  ? Fatigue 01/20/2016  ? Insomnia 01/20/2016  ? Trapezius muscle spasm 01/20/2016  ? Fibromyalgia 07/03/2012  ? Colitis   ? ?PCP:  Scifres, Dorothy, PA-C (Inactive) ?Pharmacy:   ?[id number] [place], [place] - [address] AT [facility] [address] [facility] [address] [facility] [address] ?[address] [place] [phone] ?Phone: [phone] Fax: [phone] ? ?Readmission Risk Interventions ?   ? View : No data to display.  ?  ?  ?  ? ?

## 2021-07-26 NOTE — Evaluation (Signed)
Occupational Therapy Evaluation ?Patient Details ?Name: Sarah Ramos ?MRN: [id number] ?DOB: 10-10-1965 ?Today's Date: 07/26/2021 ? ? ?History of Present Illness 56 yo female s/p left knee open medial patellofemoral ligament Reconstruction, loose body removal, chondroplasty of the patella. PMH: fibromyalgia, chronic fatigue  ? ?Clinical Impression ?  ?Patient is a 56 year old female who was admitted for above. Patient was noted to have increased pain, decreased ROM of L knee, decreased standing balance, decreased functional activity tolerance and decreased endurance impacting participation in ADLs. Patient lives at home alone with boyfriend to help PRN. Patient is not at a safe functional level to be able to d/c home without 24/7 caregiver support at this time. Patient would continue to benefit from skilled OT services at this time while admitted and after d/c to address noted deficits in order to improve overall safety and independence in ADLs.  ? ?   ? ?Recommendations for follow up therapy are one component of a multi-disciplinary discharge planning process, led by the attending physician.  Recommendations may be updated based on patient status, additional functional criteria and insurance authorization.  ? ?Follow Up Recommendations ? Skilled nursing-short term rehab (<3 hours/day)  ?  ?Assistance Recommended at Discharge Frequent or constant Supervision/Assistance  ?Patient can return home with the following A lot of help with walking and/or transfers;A lot of help with bathing/dressing/bathroom;Direct supervision/assist for financial management;Assistance with cooking/housework;Assist for transportation;Help with stairs or ramp for entrance;Direct supervision/assist for medications management ? ?  ?Functional Status Assessment ? Patient has had a recent decline in their functional status and demonstrates the ability to make significant improvements in function in a reasonable and predictable amount of time.   ?Equipment Recommendations ? Other (comment) (total hip kit)  ?  ?Recommendations for Other Services   ? ? ?  ?Precautions / Restrictions Precautions ?Precautions: Fall ?Precaution Comments: assume NO ROM L KNEE ?Required Braces or Orthoses: Other Brace ?Other Brace: bledsoe has KI in room to try on 5/2 ?Restrictions ?Weight Bearing Restrictions: No ?Other Position/Activity Restrictions: WBAT  ? ?  ? ?Mobility Bed Mobility ?Overal bed mobility: Needs Assistance ?Bed Mobility: Supine to Sit, Sit to Supine ?  ?  ?Supine to sit: Min assist ?Sit to supine: Min guard, Supervision ?  ?General bed mobility comments: patient needed increased time for movements with leg lifted use to get LLE into bed with min guard. ?  ? ?Transfers ?  ?  ?  ?  ?  ?  ?  ?  ?  ?  ?  ? ?  ?Balance Overall balance assessment: Needs assistance ?Sitting-balance support: No upper extremity supported, Feet supported ?Sitting balance-Leahy Scale: Fair ?  ?  ?Standing balance support: Single extremity supported, No upper extremity supported, During functional activity ?Standing balance-Leahy Scale: Fair ?  ?  ?  ?  ?  ?  ?  ?  ?  ?  ?  ?  ?   ? ?ADL either performed or assessed with clinical judgement  ? ?ADL Overall ADL's : Needs assistance/impaired ?Eating/Feeding: Modified independent;Sitting ?  ?Grooming: Dance movement psychotherapist;Set up;Sitting ?  ?Upper Body Bathing: Minimal assistance;Sitting ?  ?Lower Body Bathing: Total assistance;Bed level ?  ?Upper Body Dressing : Set up;Sitting ?  ?Lower Body Dressing: Total assistance;Bed level ?  ?Toilet Transfer: Minimal assistance;Rolling walker (2 wheels);Regular Toilet;Ambulation;BSC/3in1;Cueing for sequencing;Cueing for safety ?Toilet Transfer Details (indicate cue type and reason): to 3 in 1 over [facility]. patient needed increased time and sequeincing cues for task. educated on  picking LLE off the floor to turn v.s. twisting on it. ?Toileting- Water quality scientist and Hygiene: Sit to/from stand;Moderate  assistance ?Toileting - Clothing Manipulation Details (indicate cue type and reason): patient was min A for hygiene tasks but assumed max a for clothing management tasks in standing. ?  ?  ?Functional mobility during ADLs: Minimal assistance;Rolling walker (2 wheels) ?General ADL Comments: with increased time for tasks. patient was educatedo n using leg lifted to get leg in and out of bed.  ? ? ? ?Vision Baseline Vision/History: 1 Wears glasses ?Patient Visual Report: No change from baseline ?   ?   ?Perception   ?  ?Praxis   ?  ? ?Pertinent Vitals/Pain Pain Assessment ?Pain Assessment: 0-10 ?Pain Score: 9  ?Pain Location: L knee ?Pain Descriptors / Indicators: Grimacing, Sore, Aching ?Pain Intervention(s): Limited activity within patient's tolerance, Monitored during session, Repositioned, Patient requesting pain meds-RN notified, RN gave pain meds during session  ? ? ? ?Hand Dominance Right ?  ?Extremity/Trunk Assessment Upper Extremity Assessment ?Upper Extremity Assessment: Overall WFL for tasks assessed ?  ?Lower Extremity Assessment ?Lower Extremity Assessment: Defer to PT evaluation ?  ?Cervical / Trunk Assessment ?Cervical / Trunk Assessment: Normal ?  ?Communication Communication ?Communication: No difficulties ?  ?Cognition Arousal/Alertness: Awake/alert ?Behavior During Therapy: [facility] for tasks assessed/performed ?Overall Cognitive Status: Within Functional Limits for tasks assessed ?  ?  ?  ?  ?  ?  ?  ?  ?  ?  ?  ?  ?  ?  ?  ?  ?  ?  ?  ?General Comments    ? ?  ?Exercises   ?  ?Shoulder Instructions    ? ? ?Home Living Family/patient expects to be discharged to:: Private residence ?Living Arrangements: Alone ?Available Help at Discharge: Available PRN/intermittently ?Type of Home: House ?Home Access: Stairs to enter ?Entrance Stairs-Number of Steps: 7 ?  ?Home Layout: Two level;Able to live on main level with bedroom/bathroom ?Alternate Level Stairs-Number of Steps: 20 ?Alternate Level Stairs-Rails:  Right ?  ?  ?  ?  ?  ?Home Equipment: Conservation officer, nature (2 wheels);Cane - single point ?  ?  ?  ? ?  ?Prior Functioning/Environment Prior Level of Function : Independent/Modified Independent ?  ?  ?  ?  ?  ?  ?  ?  ?  ? ?  ?  ?OT Problem List: Decreased activity tolerance;Impaired balance (sitting and/or standing);Decreased safety awareness;Pain;Decreased knowledge of precautions;Decreased knowledge of use of DME or AE ?  ?   ?OT Treatment/Interventions: Self-care/ADL training;Therapeutic exercise;Neuromuscular education;Energy conservation;DME and/or AE instruction;Therapeutic activities;Balance training;Patient/family education  ?  ?OT Goals(Current goals can be found in the care plan section) Acute Rehab OT Goals ?Patient Stated Goal: to get pain under control ?OT Goal Formulation: With patient ?Time For Goal Achievement: 08/09/21 ?Potential to Achieve Goals: Good  ?OT Frequency: Min 2X/week ?  ? ?Co-evaluation PT/OT/SLP Co-Evaluation/Treatment: Yes ?Reason for Co-Treatment: To address functional/ADL transfers ?PT goals addressed during session: Mobility/safety with mobility ?OT goals addressed during session: ADL's and self-care ?  ? ?  ?AM-PAC OT "6 Clicks" Daily Activity     ?Outcome Measure Help from another person eating meals?: None ?Help from another person taking care of personal grooming?: A Little ?Help from another person toileting, which includes using toliet, bedpan, or urinal?: A Lot ?Help from another person bathing (including washing, rinsing, drying)?: A Lot ?Help from another person to put on and taking off regular upper body clothing?:  A Little ?Help from another person to put on and taking off regular lower body clothing?: A Lot ?6 Click Score: 16 ?  ?End of Session Equipment Utilized During Treatment: Gait belt;Rolling walker (2 wheels) ?Nurse Communication: Patient requests pain meds ? ?Activity Tolerance: Patient tolerated treatment well ?Patient left: in bed;with call bell/phone within  reach;with nursing/sitter in room ? ?OT Visit Diagnosis: Unsteadiness on feet (R26.81);Muscle weakness (generalized) (M62.81);Other abnormalities of gait and mobility (R26.89);Pain ?Pain - Right/Left: Left ?Pain - part of

## 2021-07-26 NOTE — Progress Notes (Signed)
Physical Therapy Treatment ?Patient Details ?Name: Sarah Ramos ?MRN: [id number] ?DOB: 11/04/1965 ?Today's Date: 07/26/2021 ? ? ?History of Present Illness 56 yo female s/p left knee open medial patellofemoral ligament Reconstruction, loose body removal, chondroplasty of the patella. PMH: fibromyalgia, chronic fatigue ? ?  ?PT Comments  ? ? Pt progressing, able to amb to bathroom and back, improving tolerance to activity but mobility remains slow and effortful.  Will continue to follow in acute setting. SNF vs d/c to friend's house pending progress/pain control.  ?  ?Recommendations for follow up therapy are one component of a multi-disciplinary discharge planning process, led by the attending physician.  Recommendations may be updated based on patient status, additional functional criteria and insurance authorization. ? ?Follow Up Recommendations ? Skilled nursing-short term rehab (<3 hours/day) (vs HHPT pending progress) ?  ?  ?Assistance Recommended at Discharge Frequent or constant Supervision/Assistance  ?Patient can return home with the following A little help with walking and/or transfers;A little help with bathing/dressing/bathroom;Assistance with cooking/housework;Assist for transportation;Help with stairs or ramp for entrance ?  ?Equipment Recommendations ? BSC/3in1  ?  ?Recommendations for Other Services   ? ? ?  ?Precautions / Restrictions Precautions ?Precautions: Fall ?Precaution Comments: assume NO ROM L KNEE ?Required Braces or Orthoses: Other Brace ?Other Brace: bledsoe has KI in room to try on 5/2 ?Restrictions ?Weight Bearing Restrictions: No ?Other Position/Activity Restrictions: WBAT  ?  ? ?Mobility ? Bed Mobility ?Overal bed mobility: Needs Assistance ?Bed Mobility: Supine to Sit, Sit to Supine ?  ?  ?Supine to sit: Min assist ?Sit to supine: Min guard, Supervision ?  ?General bed mobility comments: instructed in use of gait belt as leg lifter, pt required assist to progress LLE off bed  completely, able to utilize leg lifter to return to supine without physical assist requires excessive time and effort for transitions ?  ? ?Transfers ?Overall transfer level: Needs assistance ?Equipment used: Rolling walker (2 wheels) ?Transfers: Sit to/from Stand ?Sit to Stand: Min guard ?  ?  ?  ?  ?  ?General transfer comment: cues for hand placement and LLE position ?  ? ?Ambulation/Gait ?Ambulation/Gait assistance: Min guard, Supervision ?Gait Distance (Feet): 18 Feet (x2) ?Assistive device: Rolling walker (2 wheels) ?Gait Pattern/deviations: Step-to pattern, Decreased stance time - left ?Gait velocity: decr ?  ?  ?General Gait Details: verbal cues for sequence, step length and step to gait pattern.  knee instability improving, manual facilitation for terminal knee extension ? ? ?Stairs ?  ?  ?  ?  ?  ? ? ?Wheelchair Mobility ?  ? ?Modified Rankin (Stroke Patients Only) ?  ? ? ?  ?Balance Overall balance assessment: Needs assistance ?Sitting-balance support: No upper extremity supported, Feet supported ?Sitting balance-Leahy Scale: Fair ?  ?  ?Standing balance support: Single extremity supported, No upper extremity supported, During functional activity ?Standing balance-Leahy Scale: Fair ?  ?  ?  ?  ?  ?  ?  ?  ?  ?  ?  ?  ?  ? ?  ?Cognition Arousal/Alertness: Awake/alert ?Behavior During Therapy: [facility] for tasks assessed/performed ?Overall Cognitive Status: Within Functional Limits for tasks assessed ?  ?  ?  ?  ?  ?  ?  ?  ?  ?  ?  ?  ?  ?  ?  ?  ?  ?  ?  ? ?  ?Exercises General Exercises - Lower Extremity ?Ankle Circles/Pumps: AROM, Both, 10 reps ?Quad Sets: Both, 5 reps,  AROM ? ?  ?General Comments   ?  ?  ? ?Pertinent Vitals/Pain Pain Assessment ?Pain Assessment: 0-10 ?Pain Score: 9  ?Pain Location: L knee ?Pain Descriptors / Indicators: Grimacing, Sore, Aching ?Pain Intervention(s): Limited activity within patient's tolerance, Monitored during session, Repositioned, Patient requesting pain meds-RN notified,  RN gave pain meds during session  ? ? ?Home Living Family/patient expects to be discharged to:: Private residence ?Living Arrangements: Alone ?Available Help at Discharge: Available PRN/intermittently ?Type of Home: House ?Home Access: Stairs to enter ?  ?Entrance Stairs-Number of Steps: 7 ?Alternate Level Stairs-Number of Steps: 20 ?Home Layout: Two level;Able to live on main level with bedroom/bathroom ?Home Equipment: Agricultural consultant (2 wheels);Cane - single point ?   ?  ?Prior Function    ?  ?  ?   ? ?PT Goals (current goals can now be found in the care plan section) Acute Rehab PT Goals ?Patient Stated Goal: be able to move more ?PT Goal Formulation: With patient ?Time For Goal Achievement: 08/09/21 ?Potential to Achieve Goals: Good ?Progress towards PT goals: Progressing toward goals ? ?  ?Frequency ? ? ? Min 5X/week ? ? ? ?  ?PT Plan Current plan remains appropriate  ? ? ?Co-evaluation PT/OT/SLP Co-Evaluation/Treatment: Yes ?Reason for Co-Treatment: To address functional/ADL transfers ?PT goals addressed during session: Mobility/safety with mobility;Proper use of DME ?  ?  ? ?  ?AM-PAC PT "6 Clicks" Mobility   ?Outcome Measure ? Help needed turning from your back to your side while in a flat bed without using bedrails?: A Little ?Help needed moving from lying on your back to sitting on the side of a flat bed without using bedrails?: A Little ?Help needed moving to and from a bed to a chair (including a wheelchair)?: A Little ?Help needed standing up from a chair using your arms (e.g., wheelchair or bedside chair)?: A Little ?Help needed to walk in hospital room?: A Little ?Help needed climbing 3-5 steps with a railing? : A Lot ?6 Click Score: 17 ? ?  ?End of Session Equipment Utilized During Treatment: Gait belt;Other (comment) (bledsoe) ?Activity Tolerance: Patient tolerated treatment well;Patient limited by fatigue;Patient limited by pain ?Patient left: in bed;with call bell/phone within reach;with bed alarm  set ?Nurse Communication: Mobility status ?PT Visit Diagnosis: Other abnormalities of gait and mobility (R26.89);Difficulty in walking, not elsewhere classified (R26.2) ?  ? ? ?Time: 7017-7939 ?PT Time Calculation (min) (ACUTE ONLY): 39 min ? ?Charges:  $Gait Training: 23-37 mins          ?          ? Delice Bison, PT ? ?Acute Rehab Dept [facility]) [phone] ?Pager [phone] ? ?07/26/2021 ? ? ? ?Artrice Kraker ?07/26/2021, 3:31 PM ? ?

## 2021-07-26 NOTE — Plan of Care (Signed)
?  Problem: Activity: ?Goal: Risk for activity intolerance will decrease ?Outcome: Progressing ?  ?Problem: Safety: ?Goal: Ability to remain free from injury will improve ?Outcome: Progressing ?  ?Problem: Pain Managment: ?Goal: General experience of comfort will improve ?Outcome: Progressing ?  ?

## 2021-07-27 NOTE — TOC Progression Note (Signed)
Transition of Care (TOC) - Progression Note  ? ?Patient Details  ?Name: SIDDALEE VANDERHEIDEN ?MRN: [id number] ?Date of Birth: July 09, 1965 ? ?Transition of Care (TOC) CM/SW Contact  ?Ewing Schlein, LCSW ?Phone Number: ?07/27/2021, 1:24 PM ? ?Clinical Narrative: CSW reviewed bed offers with patient. Patient selected [facility]. CSW spoke with Steward Drone at [facility] to confirm bed acceptance. Facility to start insurance authorization. TOC awaiting insurance approval for rehab. ? ?Expected Discharge Plan: Skilled Nursing Facility ?Barriers to Discharge: Continued Medical Work up, SNF Pending bed offer, Insurance Authorization ? ?Expected Discharge Plan and Services ?Expected Discharge Plan: Skilled Nursing Facility ?In-house Referral: Clinical Social Work ?Post Acute Care Choice: Skilled Nursing Facility ?Living arrangements for the past 2 months: Single Family Home           ?DME Arranged: N/A ?DME Agency: NA ? ?Readmission Risk Interventions ?   ? View : No data to display.  ?  ?  ?  ? ?

## 2021-07-27 NOTE — Progress Notes (Signed)
Occupational Therapy Treatment ?Patient Details ?Name: Sarah Ramos ?MRN: [id number] ?DOB: Aug 28, 1965 ?Today's Date: 07/27/2021 ? ? ?History of present illness 56 yo female s/p left knee open medial patellofemoral ligament Reconstruction, loose body removal, chondroplasty of the patella. PMH: fibromyalgia, chronic fatigue ?  ?OT comments ? Patient was noted to have continued increased pain with pain medication onboard at this time. Patient was noted to have increased loose stools on this date impacting session as well. Nursing aware. Patient was min A wit increased time to transition to edge of bed with use of bed rails and increased time for all tasks. Patient used KI today during session and reported she would prefer to use other brace. PT updated. Patient would continue to benefit from skilled OT services at this time while admitted and after d/c to address noted deficits in order to improve overall safety and independence in ADLs.  ?  ? ?Recommendations for follow up therapy are one component of a multi-disciplinary discharge planning process, led by the attending physician.  Recommendations may be updated based on patient status, additional functional criteria and insurance authorization. ?   ?Follow Up Recommendations ? Skilled nursing-short term rehab (<3 hours/day)  ?  ?Assistance Recommended at Discharge Frequent or constant Supervision/Assistance  ?Patient can return home with the following ? A lot of help with walking and/or transfers;A lot of help with bathing/dressing/bathroom;Direct supervision/assist for financial management;Assistance with cooking/housework;Assist for transportation;Help with stairs or ramp for entrance;Direct supervision/assist for medications management ?  ?Equipment Recommendations ? Other (comment) (total hip kit)  ?  ?Recommendations for Other Services   ? ?  ?Precautions / Restrictions Precautions ?Precautions: Fall ?Required Braces or Orthoses: Other Brace ?Other Brace: tried  KI today ?Restrictions ?Weight Bearing Restrictions: No ?Other Position/Activity Restrictions: WBAT  ? ? ?  ? ?Mobility Bed Mobility ?Overal bed mobility: Needs Assistance ?Bed Mobility: Supine to Sit, Sit to Supine ?  ?  ?Supine to sit: Min assist ?  ?  ?General bed mobility comments: with significantly increased time with patient reporting more pain and having a bad night last night ?  ? ?Transfers ?  ?  ?  ?  ?  ?  ?  ?  ?  ?  ?  ?  ?Balance Overall balance assessment: Needs assistance ?Sitting-balance support: No upper extremity supported, Feet supported ?Sitting balance-Leahy Scale: Fair ?  ?  ?Standing balance support: Single extremity supported, No upper extremity supported, During functional activity ?Standing balance-Leahy Scale: Fair ?  ?  ?  ?  ?  ?  ?  ?  ?  ?  ?  ?  ?   ? ?ADL either performed or assessed with clinical judgement  ? ?ADL Overall ADL's : Needs assistance/impaired ?  ?  ?Grooming: Dance movement psychotherapist;Set up;Sitting ?  ?  ?  ?  ?  ?  ?  ?  ?  ?Toilet Transfer: Minimal assistance;Rolling walker (2 wheels);Regular Toilet;Ambulation;BSC/3in1;Cueing for sequencing;Cueing for safety ?Toilet Transfer Details (indicate cue type and reason): to 3 in 1 patients pain levels and urgercy limiting to bedside on this date.. patient needed increased time and sequeincing cues for task. educated on picking LLE off the floor to turn v.s. twisting on it. ?Toileting- Water quality scientist and Hygiene: Sit to/from stand;Moderate assistance ?Toileting - Clothing Manipulation Details (indicate cue type and reason): patient was min A for hygiene tasks but assumed max a for clothing management tasks in standing. ?  ?  ?  ?  ?  ? ?  Extremity/Trunk Assessment   ?  ?  ?  ?  ?  ? ?Vision   ?  ?  ?Perception   ?  ?Praxis   ?  ? ?Cognition Arousal/Alertness: Awake/alert ?Behavior During Therapy: [facility] for tasks assessed/performed ?Overall Cognitive Status: Within Functional Limits for tasks assessed ?  ?  ?  ?  ?  ?  ?  ?  ?  ?   ?  ?  ?  ?  ?  ?  ?  ?  ?  ?   ?Exercises   ? ?  ?Shoulder Instructions   ? ? ?  ?General Comments    ? ? ?Pertinent Vitals/ Pain       Pain Assessment ?Pain Assessment: 0-10 ?Pain Score: 9  ?Pain Location: L knee ?Pain Descriptors / Indicators: Grimacing, Sore, Aching ?Pain Intervention(s): Limited activity within patient's tolerance, Monitored during session, Premedicated before session, Repositioned ? ?Home Living   ?  ?  ?  ?  ?  ?  ?  ?  ?  ?  ?  ?  ?  ?  ?  ?  ?  ?  ? ?  ?Prior Functioning/Environment    ?  ?  ?  ?   ? ?Frequency ? Min 2X/week  ? ? ? ? ?  ?Progress Toward Goals ? ?OT Goals(current goals can now be found in the care plan section) ? Progress towards OT goals: Progressing toward goals ? ?   ?Plan Discharge plan remains appropriate   ? ?Co-evaluation ? ? ?   ?  ?  ?  ?  ? ?  ?AM-PAC OT "6 Clicks" Daily Activity     ?Outcome Measure ? ? Help from another person eating meals?: None ?Help from another person taking care of personal grooming?: A Little ?Help from another person toileting, which includes using toliet, bedpan, or urinal?: A Lot ?Help from another person bathing (including washing, rinsing, drying)?: A Lot ?Help from another person to put on and taking off regular upper body clothing?: A Little ?Help from another person to put on and taking off regular lower body clothing?: A Lot ?6 Click Score: 16 ? ?  ?End of Session Equipment Utilized During Treatment: Gait belt;Rolling walker (2 wheels) ? ?OT Visit Diagnosis: Unsteadiness on feet (R26.81);Muscle weakness (generalized) (M62.81);Other abnormalities of gait and mobility (R26.89);Pain ?Pain - Right/Left: Left ?Pain - part of body: Leg ?  ?Activity Tolerance Patient limited by pain ?  ?Patient Left with call bell/phone within reach;in chair;with chair alarm set ?  ?Nurse Communication   ?  ? ?   ? ?Time: 9458-5929 ?OT Time Calculation (min): 40 min ? ?Charges: OT General Charges ?$OT Visit: 1 Visit ?OT Treatments ?$Self Care/Home  Management : 38-52 mins ? ?Vashon Arch OTR/L, MS ?Acute Rehabilitation Department ?Office# [phone] ?Pager# [phone] ? ? ?Feliz Beam Brandace Cargle ?07/27/2021, 11:05 AM ?

## 2021-07-27 NOTE — Progress Notes (Signed)
Physical Therapy Treatment ?Patient Details ?Name: Sarah Ramos ?MRN: [id number] ?DOB: 03/16/1966 ?Today's Date: 07/27/2021 ? ? ?History of Present Illness 56 yo female s/p left knee open medial patellofemoral ligament Reconstruction, loose body removal, chondroplasty of the patella. PMH: fibromyalgia, chronic fatigue ? ?  ?PT Comments  ? ? Pt reported she has been up OOB several times today and is wearing out. Pt agreeable to try ambulation to the the BR, but was unable to make it that far due to pain and fatigue. Pt ambulated approx 6 ft with with RW min guard assist. Pt had difficulty advancing LLE due to weakness and pain. Pt assisted with BSC transfer and then returned back to bed. Agree with ST SNF placement for continued rehab prior to d/c home. Pt will benefit from continued acute skilled PT to maximize mobility and independence for next venue of care.  ?Recommendations for follow up therapy are one component of a multi-disciplinary discharge planning process, led by the attending physician.  Recommendations may be updated based on patient status, additional functional criteria and insurance authorization. ? ?Follow Up Recommendations ? Skilled nursing-short term rehab (<3 hours/day) ?  ?  ?Assistance Recommended at Discharge    ?Patient can return home with the following A little help with walking and/or transfers;A little help with bathing/dressing/bathroom;Assistance with cooking/housework;Assist for transportation;Help with stairs or ramp for entrance ?  ?Equipment Recommendations ? BSC/3in1  ?  ?Recommendations for Other Services   ? ? ?  ?Precautions / Restrictions Precautions ?Precautions: Fall ?Precaution Comments: assume NO ROM L KNEE ?Required Braces or Orthoses: Other Brace ?Other Brace: KI, bledsoe brace ?Restrictions ?Weight Bearing Restrictions: No ?Other Position/Activity Restrictions: WBAT  ?  ? ?Mobility ? Bed Mobility ?Overal bed mobility: Needs Assistance ?Bed Mobility: Sit to Supine ?  ?   ?  ?Sit to supine: Min assist ?  ?General bed mobility comments: assistance lifting L LE into bed ?  ? ?Transfers ?Overall transfer level: Needs assistance ?Equipment used: Rolling walker (2 wheels) ?Transfers: Sit to/from Stand ?Sit to Stand: Min guard ?  ?  ?  ?  ?  ?General transfer comment: cues for hand placement and LLE position ?  ? ?Ambulation/Gait ?Ambulation/Gait assistance: Min guard ?Gait Distance (Feet): 6 Feet ?Assistive device: Rolling walker (2 wheels) ?Gait Pattern/deviations: Step-to pattern, Decreased stance time - left ?Gait velocity: decr ?  ?  ?General Gait Details: Pt had difficulty clearing and advancing L LE with gait, attempted to walk to BR and she decided she was unable, brought BSC to her and then assisted with toielting and returned back to bed with RW. ? ? ?Stairs ?  ?  ?  ?  ?  ? ? ?Wheelchair Mobility ?  ? ?Modified Rankin (Stroke Patients Only) ?  ? ? ?  ?Balance Overall balance assessment: No apparent balance deficits (not formally assessed) ?  ?  ?  ?  ?Standing balance support: During functional activity ?Standing balance-Leahy Scale: Fair ?  ?  ?  ?  ?  ?  ?  ?  ?  ?  ?  ?  ?  ? ?  ?Cognition Arousal/Alertness: Awake/alert ?Behavior During Therapy: [facility] for tasks assessed/performed ?Overall Cognitive Status: Within Functional Limits for tasks assessed ?  ?  ?  ?  ?  ?  ?  ?  ?  ?  ?  ?  ?  ?  ?  ?  ?  ?  ?  ? ?  ?Exercises   ? ?  ?  General Comments General comments (skin integrity, edema, etc.): No LOB with activity, pt reported she has been up and walking 3-4 times today and is worn out and tired. Pt is requesting purwick so she can rest some. Pt reported increased discomfort with KI, but with the bledsoe brace she has some knee buckling in the past therefore we decided to continue trying the KI today. ?  ?  ? ?Pertinent Vitals/Pain Pain Assessment ?Pain Assessment: 0-10 ?Pain Score: 9  ?Pain Location: L knee ?Pain Descriptors / Indicators: Grimacing, Sore, Aching ?Pain  Intervention(s): Limited activity within patient's tolerance, Repositioned, Monitored during session, Premedicated before session  ? ? ?Home Living   ?  ?  ?  ?  ?  ?  ?  ?  ?  ?   ?  ?Prior Function    ?  ?  ?   ? ?PT Goals (current goals can now be found in the care plan section) Progress towards PT goals: Progressing toward goals ? ?  ?Frequency ? ? ? Min 5X/week ? ? ? ?  ?PT Plan Current plan remains appropriate  ? ? ?Co-evaluation   ?  ?  ?  ?  ? ?  ?AM-PAC PT "6 Clicks" Mobility   ?Outcome Measure ? Help needed turning from your back to your side while in a flat bed without using bedrails?: A Little ?Help needed moving from lying on your back to sitting on the side of a flat bed without using bedrails?: A Little ?Help needed moving to and from a bed to a chair (including a wheelchair)?: A Little ?Help needed standing up from a chair using your arms (e.g., wheelchair or bedside chair)?: A Little ?Help needed to walk in hospital room?: A Little ?Help needed climbing 3-5 steps with a railing? : A Lot ?6 Click Score: 17 ? ?  ?End of Session Equipment Utilized During Treatment: Gait belt ?Activity Tolerance: Patient limited by pain ?Patient left: with bed alarm set;in bed;with call bell/phone within reach ?Nurse Communication: Mobility status ?PT Visit Diagnosis: Other abnormalities of gait and mobility (R26.89);Difficulty in walking, not elsewhere classified (R26.2) ?  ? ? ?Time: 4709-6283 ?PT Time Calculation (min) (ACUTE ONLY): 32 min ? ?Charges:  $Gait Training: 8-22 mins ?$Therapeutic Activity: 8-22 mins          ?          ? ? ?Greggory Stallion ?07/27/2021, 12:18 PM ? ?

## 2021-07-27 NOTE — Progress Notes (Signed)
Reviewed PT notes regarding slow progress still requiring SNF placement pending insurance approval.  With TOC on board. Vitals signs remain stable. Will follow up with patient in the AM.  ?

## 2021-07-27 NOTE — Progress Notes (Signed)
24 hour chart audit completed 

## 2021-07-28 MED ORDER — OXYCODONE HCL 5 MG PO TABS: 5.0000 mg | ORAL_TABLET | ORAL | 0 refills | Status: DC | PRN

## 2021-07-28 MED ORDER — CYCLOBENZAPRINE HCL 5 MG PO TABS
5.0000 mg | ORAL_TABLET | Freq: Three times a day (TID) | ORAL | 1 refills | Status: AC | PRN
Start: 2021-07-28 — End: ?

## 2021-07-28 MED ORDER — ASPIRIN EC 81 MG PO TBEC
81.0000 mg | DELAYED_RELEASE_TABLET | Freq: Every day | ORAL | 0 refills | Status: AC
Start: 2021-07-28 — End: ?

## 2021-07-28 MED ORDER — KETOROLAC TROMETHAMINE 30 MG/ML IJ SOLN
30.0000 mg | Freq: Three times a day (TID) | INTRAMUSCULAR | Status: DC
  Administered 2021-07-28: 30 mg via INTRAVENOUS
  Filled 2021-07-28: qty 1

## 2021-07-28 MED ORDER — VITAMIN D 25 MCG (1000 UNIT) PO TABS: 1000.0000 [IU] | ORAL_TABLET | Freq: Every day | ORAL | Status: AC

## 2021-07-28 MED ORDER — METHOCARBAMOL 500 MG PO TABS
500.0000 mg | ORAL_TABLET | Freq: Four times a day (QID) | ORAL | Status: DC
  Administered 2021-07-28 (×2): 500 mg via ORAL
  Filled 2021-07-28 (×2): qty 1

## 2021-07-28 NOTE — TOC Transition Note (Signed)
Transition of Care (TOC) - CM/SW Discharge Note ? ?Patient Details  ?Name: Sarah Ramos ?MRN: [id number] ?Date of Birth: [birth year] ? ?Transition of Care (TOC) CM/SW Contact:  ?Ewing Schlein, LCSW ?Phone Number: ?07/28/2021, 1:29 PM ? ?Clinical Narrative: Insurance has approved patient to discharge to [facility] today. CSW confirmed approval with Gigi Gin in admissions that patient can be admitted today. The number for report is [phone]. Discharge summary, discharge orders, and SNF transfer report faxed to facility. Medical necessity form done; PTAR scheduled. Discharge packet completed. Patient notified of insurance approval and discharge. RN updated. TOC signing off. ? ?Final next level of care: Skilled Nursing Facility ?Barriers to Discharge: Barriers Resolved ? ?Patient Goals and CMS Choice ?Patient states their goals for this hospitalization and ongoing recovery are:: Go to short-term rehab and then return home ?CMS Medicare.gov Compare Post Acute Care list provided to:: Patient ?Choice offered to / list presented to : Patient ? ?Discharge Placement ?PASRR number recieved: 07/26/21      ?Patient chooses bed at: [facility] [place] ?Patient to be transferred to facility by: PTAR ?Patient and family notified of of transfer: 07/28/21 ? ?Discharge Plan and Services ?In-house Referral: Clinical Social Work ?Post Acute Care Choice: Skilled Nursing Facility          ?DME Arranged: N/A ?DME Agency: NA ? ?Readmission Risk Interventions ?   ? View : No data to display.  ?  ?  ?  ? ?

## 2021-07-28 NOTE — Progress Notes (Signed)
Called and report to nurse Vaughan Basta. ?

## 2021-07-28 NOTE — Discharge Summary (Signed)
Patient ID: ?Sarah Ramos ?MRN: [id number] ?DOB/AGE: 1965-09-12 56 y.o. ? ?Admit date: 07/25/2021 ?Discharge date: 07/28/2021 ? ?Primary Diagnosis: Patellar instability of the left knee ?Admission Diagnoses: Status post left knee arthroscopy with MPFL reconstruction ?Past Medical History:  ?Diagnosis Date  ? Arthritis   ? Chronic fatigue   ? CIN I (cervical intraepithelial neoplasia I) 03/28/2003  ? Colitis   ? Fibromyalgia   ? GERD (gastroesophageal reflux disease)   ? Headache   ? Skin rash   ? Ulcerative colitis (HCC)   ? ?Discharge Diagnoses:   ?Principal Problem: ?  Patellar instability of left knee ?Active Problems: ?  S/P arthroscopic surgery of left knee ? ?Estimated body mass index is 41.26 kg/m? as calculated from the following: ?  Height as of this encounter: 5\' 7"  (1.702 m). ?  Weight as of this encounter: 119.5 kg. ? ?Procedure:  ?Procedure(s) (LRB): ?KNEE ARTHROSCOPY WITH REMOVAL OF FOREIGN BODY  MEDIAL PATELLAR FEMORAL LIGAMENT RECONSTRUCTION WITH ALLOGRAFT (Left)  ? ?Consults: None ? ?HPI: Sarah Ramos is a 56 year old female who presented to our office following an acute knee injury and complaints of patellar instability.  MRI was obtained revealing MPFL tear of the left knee patient continued to have instability events despite conservative treatment.  She was reevaluated and indicated for reconstruction of the MPFL ligament for patellar stabilization.  Patient presented to the operating room on 07/25/2021 for surgery.  She was admitted for postoperative physical therapy, pain control, and monitoring.   ?Laboratory Data: ?Admission on 07/25/2021  ?Component Date Value Ref Range Status  ? WBC 07/25/2021 9.6  4.0 - 10.5 K/uL Final  ? RBC 07/25/2021 4.10  3.87 - 5.11 MIL/uL Final  ? Hemoglobin 07/25/2021 13.5  12.0 - 15.0 g/dL Final  ? HCT 78/29/562105/03/2021 39.4  36.0 - 46.0 % Final  ? MCV 07/25/2021 96.1  80.0 - 100.0 fL Final  ? MCH 07/25/2021 32.9  26.0 - 34.0 pg Final  ? MCHC 07/25/2021 34.3  30.0 - 36.0  g/dL Final  ? RDW 30/86/578405/03/2021 12.9  11.5 - 15.5 % Final  ? Platelets 07/25/2021 247  150 - 400 K/uL Final  ? nRBC 07/25/2021 0.0  0.0 - 0.2 % Final  ? Performed at [facility], [address] [place], [place] [address]  ? Creatinine, Ser 07/25/2021 0.73  0.44 - 1.00 mg/dL Final  ? GFR, Estimated 07/25/2021 >60  >60 mL/min Final  ? Comment: (NOTE) ?Calculated using the CKD-EPI Creatinine Equation (2021) ?Performed at [facility], [address] ?[place], [place] [address] ?  ?Hospital Outpatient Visit on 07/22/2021  ?Component Date Value Ref Range Status  ? Sodium 07/22/2021 139  135 - 145 mmol/L Final  ? Potassium 07/22/2021 3.8  3.5 - 5.1 mmol/L Final  ? Chloride 07/22/2021 108  98 - 111 mmol/L Final  ? CO2 07/22/2021 25  22 - 32 mmol/L Final  ? Glucose, Bld 07/22/2021 100 (H)  70 - 99 mg/dL Final  ? Glucose reference range applies only to samples taken after fasting for at least 8 hours.  ? BUN 07/22/2021 13  6 - 20 mg/dL Final  ? Creatinine, Ser 07/22/2021 0.84  0.44 - 1.00 mg/dL Final  ? Calcium 32/44/010204/28/2023 8.9  8.9 - 10.3 mg/dL Final  ? GFR, Estimated 07/22/2021 >60  >60 mL/min Final  ? Comment: (NOTE) ?Calculated using the CKD-EPI Creatinine Equation (2021) ?  ? Anion gap 07/22/2021 6  5 - 15 Final  ? Performed  at [facility], [address] [place], [place] [address]  ? WBC 07/22/2021 5.9  4.0 - 10.5 K/uL Final  ? RBC 07/22/2021 4.17  3.87 - 5.11 MIL/uL Final  ? Hemoglobin 07/22/2021 13.5  12.0 - 15.0 g/dL Final  ? HCT 86/76/7209 40.0  36.0 - 46.0 % Final  ? MCV 07/22/2021 95.9  80.0 - 100.0 fL Final  ? MCH 07/22/2021 32.4  26.0 - 34.0 pg Final  ? MCHC 07/22/2021 33.8  30.0 - 36.0 g/dL Final  ? RDW 47/11/6281 13.0  11.5 - 15.5 % Final  ? Platelets 07/22/2021 250  150 - 400 K/uL Final  ? nRBC 07/22/2021 0.0  0.0 - 0.2 % Final  ? Performed at [facility], [address] [place], [place] [address]  ?  ? ?X-Rays:DG Knee 1-2 Views  Left ? ?Result Date: 07/25/2021 ?CLINICAL DATA:  Medial patellofemoral ligament repair EXAM: LEFT KNEE - 1-2 VIEW COMPARISON:  None available FINDINGS: Lateral intraoperative fluoroscopic spot images document surgical instrument overlying distal femur. IMPRESSION: Intraoperative fluoroscopic guidance for orthopedic procedure Electronically Signed   By: Corlis Leak M.D.   On: 07/25/2021 16:52  ? ?DG C-Arm 1-60 Min-No Report ? ?Result Date: 07/25/2021 ?Fluoroscopy was utilized by the requesting physician.  No radiographic interpretation.   ? ?EKG:No orders found for this or any previous visit.  ? ?Hospital Course: Sarah Ramos is a 56 y.o. who was admitted to Hospital. They were brought to the operating room on 07/25/2021 and underwent Procedure(s): ?KNEE ARTHROSCOPY WITH REMOVAL OF FOREIGN BODY  MEDIAL PATELLAR FEMORAL LIGAMENT RECONSTRUCTION WITH ALLOGRAFT.  Patient tolerated the procedure well and was later transferred to the recovery room and then to the orthopaedic floor for postoperative care.  They were given PO and IV analgesics for pain control following their surgery.  They were given 24 hours of postoperative antibiotics of  ?Anti-infectives (From admission, onward)  ? ? Start     Dose/Rate Route Frequency Ordered Stop  ? 07/25/21 2100  ceFAZolin (ANCEF) IVPB 2g/100 mL premix       ? 2 g ?200 mL/hr over 30 Minutes Intravenous Every 6 hours 07/25/21 1859 07/26/21 0916  ? 07/25/21 1300  ceFAZolin (ANCEF) IVPB 3g/100 mL premix       ? 3 g ?200 mL/hr over 30 Minutes Intravenous On call to O.R. 07/25/21 1249 07/25/21 1543  ? ?  ? and started on DVT prophylaxis in the form of Lovenox.   PT and OT were ordered for total joint protocol.  Discharge planning consulted to help with postop disposition and equipment needs.  Patient had an uneventful night on the evening of surgery.  They started to get up OOB with therapy on day one. .  Continued to work with therapy into day two.   By day three, the patient had progressed  with therapy and decision was made that with slow progression that a skilled nursing facility would be indicated due to inability to complete ADLs following surgery..  Patient was seen in rounds and was ready for discharge to skilled nursing facility. ? ? ?Diet: Regular diet ?Activity:WBAT with leg in extension in brace ?Follow-up:in 2 weeks with Calvin Chura PA-C or Dr. Aundria Rud ?Take 81 mg of aspirin for DVT prophylaxis daily for 6 weeks. ?Disposition - Skilled nursing facility ?Discharged Condition: good ? ? ?Discharge Instructions   ? ? Call MD / Call 911   Complete by: As directed ?  ? If you experience chest  pain or shortness of breath, CALL 911 and be transported to the hospital emergency room.  If you develope a fever above 101 F, pus (white drainage) or increased drainage or redness at the wound, or calf pain, call your surgeon's office.  ? Constipation Prevention   Complete by: As directed ?  ? Drink plenty of fluids.  Prune juice may be helpful.  You may use a stool softener, such as Colace (over the counter) 100 mg twice a day.  Use MiraLax (over the counter) for constipation as needed.  ? Diet - low sodium heart healthy   Complete by: As directed ?  ? Increase activity slowly as tolerated   Complete by: As directed ?  ? Post-operative opioid taper instructions:   Complete by: As directed ?  ? POST-OPERATIVE OPIOID TAPER INSTRUCTIONS: ?It is important to wean off of your opioid medication as soon as possible. If you do not need pain medication after your surgery it is ok to stop day one. ?Opioids include: ?Codeine, Hydrocodone(Norco, Vicodin), Oxycodone(Percocet, oxycontin) and hydromorphone amongst others.  ?Long term and even short term use of opiods can cause: ?Increased pain response ?Dependence ?Constipation ?Depression ?Respiratory depression ?And more.  ?Withdrawal symptoms can include ?Flu like symptoms ?Nausea, vomiting ?And more ?Techniques to manage these symptoms ?Hydrate well ?Eat regular  healthy meals ?Stay active ?Use relaxation techniques(deep breathing, meditating, yoga) ?Do Not substitute Alcohol to help with tapering ?If you have been on opioids for less than two weeks and do not ha

## 2021-07-28 NOTE — Plan of Care (Signed)
?  Problem: Activity: ?Goal: Risk for activity intolerance will decrease ?Outcome: Progressing ?  ?Problem: Safety: ?Goal: Ability to remain free from injury will improve ?Outcome: Progressing ?  ?Problem: Pain Managment: ?Goal: General experience of comfort will improve ?Outcome: Progressing ?  ?

## 2021-07-28 NOTE — Progress Notes (Signed)
? ?  Subjective: ? ?Sarah Ramos is a 56 y.o. female, 3 Days Post-Op  ?  s/p Procedure(s): ?KNEE ARTHROSCOPY WITH REMOVAL OF FOREIGN BODY  MEDIAL PATELLAR FEMORAL LIGAMENT RECONSTRUCTION WITH ALLOGRAFT ? ? ?Patient reports pain as moderate to severe the pain medications help.  She reports that she unfortunately can with some diarrhea of which had her getting up out of bed multiple times which fatigued her.  She still has significant weakness of the left leg is unable to do ADLs.  She denies calf pain.  Denies numbness or tingling.  Reports sensation has nearly fully returned to the thigh region. ? ?Objective:  ? ?VITALS:   ?Vitals:  ? 07/27/21 0638 07/27/21 1327 07/27/21 2115 07/28/21 0530  ?BP: 108/77 124/71 (!) 115/57 104/73  ?Pulse: 70 73 74 61  ?Resp: 18 16 17 17   ?Temp: 97.8 ?F (36.6 ?C) 98.2 ?F (36.8 ?C) 98.2 ?F (36.8 ?C) 98.2 ?F (36.8 ?C)  ?TempSrc:  Oral Oral Oral  ?SpO2: 100% 99% 95% 99%  ?Weight:      ?Height:      ? ? ?Constitutional: General Appearance: healthy-appearing, well-nourished, and well-developed.obese, in hospital bed ?Level of Distress: no acute distress. ?Eyes: Lens (normal) clear: both eyes. ?Head: Head: normocephalic and atraumatic. ?Lungs: Respiratory effort: no dyspnea. ?Skin: Inspection and palpation: no rash, lesions ?Neurologic: Cranial Nerves: grossly intact. Sensation: grossly intact. ?Psychiatric: Insight: good judgement and insight. Mental Status: normal mood and affect and active and alert. Orientation: to time, place, and person. ? ?Left Knee:  ?Neurologically intact ?ABD soft ?Neurovascular intact ?Sensation intact distally ?Intact pulses distally ?Dorsiflexion/Plantar flexion intact ?Incision: dressing C/D/I ?Calf soft, nontender, able to wiggle ankles, toes.  T ROM brace present  ? ?Unable to do a straight leg raise ?Lab Results  ?Component Value Date  ? WBC 9.6 07/25/2021  ? HGB 13.5 07/25/2021  ? HCT 39.4 07/25/2021  ? MCV 96.1 07/25/2021  ? PLT 247 07/25/2021  ? ?BMET ?    ?Component Value Date/Time  ? NA 139 07/22/2021 0933  ? K 3.8 07/22/2021 0933  ? CL 108 07/22/2021 0933  ? CO2 25 07/22/2021 0933  ? GLUCOSE 100 (H) 07/22/2021 0933  ? BUN 13 07/22/2021 0933  ? CREATININE 0.73 07/25/2021 2023  ? CREATININE 0.92 11/25/2019 0909  ? CALCIUM 8.9 07/22/2021 0933  ? GFRNONAA >60 07/25/2021 2023  ? GFRNONAA 83 07/18/2016 1406  ? ? ? ?Assessment/Plan: ?3 Days Post-Op  ? ?Principal Problem: ?  Patellar instability of left knee ?Active Problems: ?  S/P arthroscopic surgery of left knee ? ? ?Advance diet ?Up with therapy ? ?Patient continues to struggle with getting out of the bed without assistance, simple ADLs such as changing.  Based on her current progress she certainly requires skilled nursing facility and is indicated as at her friend's house she would not have 24-hour supervision.  TOC is on board and is worked on 07/20/2016 for Animal nutritionist.  We will continue to follow this. ? ?Pain medications and muscle relaxer printed and placed in the chart.  ? ?Discontinued laxatives following diarrhea episode. ? ?For pain control have added toward Toradol for 3 doses, Robaxin. ? ?Weightbearing Status: Partial while block is active then WBAT once quadriceps function return while in extension in knee immobilizer or TROM either are appropriate ?DVT Prophylaxis: lovenox inpatient, aspirin 81mg  outpatient ? ? ?[employer] Sarah Ramos ?07/28/2021, 7:24 AM ? ?Oretha Caprice PA-C  ?Physician Assistant with Dr. 09/27/2021 [place] ? ?

## 2021-08-25 ENCOUNTER — Ambulatory Visit: Payer: No Typology Code available for payment source

## 2021-09-16 ENCOUNTER — Ambulatory Visit
Admission: RE | Admit: 2021-09-16 | Discharge: 2021-09-16 | Disposition: A | Discharge status: Home / Self Care | Payer: No Typology Code available for payment source | Source: Ambulatory Visit | Attending: Nurse Practitioner | Admitting: Nurse Practitioner

## 2021-09-16 DIAGNOSIS — Z1231 Encounter for screening mammogram for malignant neoplasm of breast: Secondary | ICD-10-CM

## 2021-09-20 ENCOUNTER — Other Ambulatory Visit (HOSPITAL_COMMUNITY): Payer: Self-pay | Admitting: Orthopedic Surgery

## 2021-09-20 DIAGNOSIS — M79605 Pain in left leg: Secondary | ICD-10-CM

## 2021-09-21 ENCOUNTER — Ambulatory Visit (HOSPITAL_COMMUNITY)
Admission: RE | Admit: 2021-09-21 | Discharge: 2021-09-21 | Disposition: A | Discharge status: Home / Self Care | Payer: No Typology Code available for payment source | Source: Ambulatory Visit | Attending: Orthopedic Surgery | Admitting: Orthopedic Surgery

## 2021-09-21 DIAGNOSIS — M7989 Other specified soft tissue disorders: Secondary | ICD-10-CM | POA: Diagnosis not present

## 2021-09-21 DIAGNOSIS — M79605 Pain in left leg: Secondary | ICD-10-CM | POA: Insufficient documentation

## 2021-09-21 NOTE — Progress Notes (Signed)
Left lower extremity venous duplex completed. Refer to "CV Proc" under chart review to view preliminary results.  09/21/2021 9:00 AM Eula Fried., MHA, RVT, RDCS, RDMS

## 2021-11-29 ENCOUNTER — Ambulatory Visit (INDEPENDENT_AMBULATORY_CARE_PROVIDER_SITE_OTHER): Payer: No Typology Code available for payment source | Admitting: Nurse Practitioner

## 2021-11-29 ENCOUNTER — Encounter: Payer: Self-pay | Admitting: Nurse Practitioner

## 2021-11-29 ENCOUNTER — Telehealth: Payer: Self-pay | Admitting: *Deleted

## 2021-11-29 VITALS — BP 126/80 | Ht 66.0 in | Wt 256.0 lb

## 2021-11-29 DIAGNOSIS — E785 Hyperlipidemia, unspecified: Secondary | ICD-10-CM

## 2021-11-29 DIAGNOSIS — Z30431 Encounter for routine checking of intrauterine contraceptive device: Secondary | ICD-10-CM | POA: Diagnosis not present

## 2021-11-29 DIAGNOSIS — R3915 Urgency of urination: Secondary | ICD-10-CM

## 2021-11-29 DIAGNOSIS — N8189 Other female genital prolapse: Secondary | ICD-10-CM

## 2021-11-29 DIAGNOSIS — R351 Nocturia: Secondary | ICD-10-CM

## 2021-11-29 DIAGNOSIS — Z01419 Encounter for gynecological examination (general) (routine) without abnormal findings: Secondary | ICD-10-CM | POA: Diagnosis not present

## 2021-11-29 NOTE — Telephone Encounter (Signed)
-----   Message from Olivia Mackie, NP sent at 11/29/2021  8:33 AM EDT ----- Regarding: Pelvic floor PT Please send referral to pelvic floor PT for urinary urgency, nocturia and pelvic floor weakness. Thanks.

## 2021-11-29 NOTE — Telephone Encounter (Signed)
Referral placed at [facility] they will call to schedule. 

## 2021-11-29 NOTE — Progress Notes (Signed)
Sarah Ramos [birth year] [id number]   History:  56 y.o. G1P1001 presents for annual exam. Complains of nighttime urination x 6 months. She is getting up every 1-2 hours and it is affecting her sleep. On Lasix, but has been for years with no change in dose and takes in the morning.  Urinates 3-5 times during the day. Does have some urgency without incontinence. Had knee replacement surgery in May and has been sleeping on her back and thinks this is making it worse. Denies dysuria or hematuria. She does snore but only takes small sips of water when she wakes. Mirena IUD 04/2016, amenorrheic. 1991 cryosurgery CIN-1, 2005 CIN-1 without intervention, subsequent paps normal.   Gynecologic History No LMP recorded. (Menstrual status: IUD).   Contraception/Family planning: IUD Sexually active: Yes  Health Maintenance Last Pap: 11/25/2020. Results were: Normal, 3-year repeat Last mammogram: 09/16/2021. Results were: Normal Last colonoscopy: 12/2019, 2-year recall d/t UC Last Dexa: Not indicated  Past medical history, past surgical history, family history and social history were all reviewed and documented in the EPIC chart. Long-term boyfriend. Works for [employer]. 55 yo son. Mother diagnosed with breast cancer at 55.   ROS:  A ROS was performed and pertinent positives and negatives are included.  Exam:  Vitals:   11/29/21 0805  BP: 126/80  Weight: 256 lb (116.1 kg)  Height: 5\' 6"  (1.676 m)    Body mass index is 41.32 kg/m.  General appearance:  Normal Thyroid:  Symmetrical, normal in size, without palpable masses or nodularity. Respiratory  Auscultation:  Clear without wheezing or rhonchi Cardiovascular  Auscultation:  Regular rate, without rubs, murmurs or gallops  Edema/varicosities:  Not grossly evident Abdominal  Soft,nontender, without masses, guarding or rebound.  Liver/spleen:  No organomegaly noted  Hernia:  None appreciated  Skin  Inspection:  Grossly  normal Breasts: Examined lying and sitting.   Right: Without masses, retractions, nipple discharge or axillary adenopathy.   Left: Without masses, retractions, nipple discharge or axillary adenopathy. Genitourinary   Inguinal/mons:  Normal without inguinal adenopathy  External genitalia:  Normal appearing vulva with no masses, tenderness, or lesions  BUS/Urethra/Skene's glands:  Normal  Vagina:  Normal appearing with normal color and discharge, no lesions  Cervix:  Normal appearing without discharge or lesions. IUD strings visible  Uterus: Difficult to palpate due to body habitus but no gross masses or tenderness  Adnexa/parametria:     Rt: Normal in size, without masses or tenderness.   Lt: Normal in size, without masses or tenderness.  Anus and perineum: Normal  Digital rectal exam: Normal sphincter tone without palpated masses or tenderness  Patient informed chaperone available to be present for breast and pelvic exam. Patient has requested no chaperone to be present. Patient has been advised what will be completed during breast and pelvic exam.   Assessment/Plan:  56 y.o. G1P1001 for annual exam.   Well female exam with routine gynecological exam - Education provided on SBEs, importance of preventative screenings, current guidelines, high calcium diet, regular exercise, and multivitamin daily.  Labs with PCP.   Encounter for routine checking of intrauterine contraceptive device (IUD) - Amenorrheic.  Mirena IUD 04/2016.  Aware of 8-year FDA approval.   Nocturia - Complains of nighttime urination x 6 months. She is getting up every 1-2 hours and it is affecting her sleep. On Lasix, but has been for years with no change in dose and takes in the morning. Urinates 3-5 times during the day. Does have some  urgency without incontinence. Has knee replacement surgery in May and has been sleeping on her back and thinks this is making it worse. Denies dysuria or hematuria. She does snore but only  takes small sips of water when she wakes. Recommend avoiding fluids 2 hours before bed. Pelvic floor PT. If no improvement we will consider medication management.   Screening for cervical cancer - 1991 cryosurgery CIN-1, 2005 CIN-1, subsequent paps normal. Will repeat at 3-year interval per guidelines.   Screening for breast cancer - Normal mammogram history.  Continue annual screenings.  Normal breast exam today.  Screening for colon cancer - 2021 colonoscopy, has screenings every 2 years d/t UC. Sees GI next month.   Return in 1 year for annual.     Olivia Mackie DNP, 9:01 AM 11/29/2021

## 2021-12-01 NOTE — Telephone Encounter (Signed)
Patient scheduled on 12/21/21 with PT

## 2021-12-21 ENCOUNTER — Other Ambulatory Visit: Payer: Self-pay

## 2021-12-21 ENCOUNTER — Ambulatory Visit: Payer: No Typology Code available for payment source | Attending: Nurse Practitioner | Admitting: Physical Therapy

## 2021-12-21 DIAGNOSIS — M6281 Muscle weakness (generalized): Secondary | ICD-10-CM | POA: Diagnosis present

## 2021-12-21 DIAGNOSIS — R3915 Urgency of urination: Secondary | ICD-10-CM | POA: Insufficient documentation

## 2021-12-21 DIAGNOSIS — R279 Unspecified lack of coordination: Secondary | ICD-10-CM | POA: Insufficient documentation

## 2021-12-21 DIAGNOSIS — N8189 Other female genital prolapse: Secondary | ICD-10-CM | POA: Diagnosis not present

## 2021-12-21 DIAGNOSIS — R351 Nocturia: Secondary | ICD-10-CM | POA: Diagnosis not present

## 2021-12-21 DIAGNOSIS — R293 Abnormal posture: Secondary | ICD-10-CM | POA: Insufficient documentation

## 2021-12-21 NOTE — Patient Instructions (Addendum)
Bladder Irritants  Certain foods and beverages can be irritating to the bladder.  Avoiding these irritants may decrease your symptoms of urinary urgency, frequency or bladder pain.  Even reducing your intake can help with your symptoms.  Not everyone is sensitive to all bladder irritants, so you may consider focusing on one irritant at a time, removing or reducing your intake of that irritant for 7-10 days to see if this change helps your symptoms.  Water intake is also very important.  Below is a list of bladder irritants.  Drinks: alcohol, carbonated beverages, caffeinated beverages such as coffee and tea, drinks with artificial sweeteners, citrus juices, apple juice, tomato juice  Foods: tomatoes and tomato based foods, spicy food, sugar and artificial sweeteners, vinegar, chocolate, raw onion, apples, citrus fruits, pineapple, cranberries, tomatoes, strawberries, plums, peaches, cantaloupe  Other: acidic urine (too concentrated) - see water intake info below  Substitutes you can try that are NOT irritating to the bladder: cooked onion, pears, papayas, sun-brewed decaf teas, watermelons, non-citrus herbal teas, apricots, kava and low-acid instant drinks (Postum).    WATER INTAKE: Remember to drink lots of water (aim for fluid intake of half your body weight with 2/3 of fluids being water).  You may be limiting fluids due to fear of leakage, but this can actually worsen urgency symptoms due to highly concentrated urine.  Water helps balance the pH of your urine so it doesn't become too acidic - acidic urine is a bladder irritant!  Urge Incontinence  Ideal urination frequency is every 2-4 wakeful hours, which equates to 5-8 times within a 24-hour period.   Urge incontinence is leakage that occurs when the bladder muscle contracts, creating a sudden need to go before getting to the bathroom.   Going too often when your bladder isn't actually full can disrupt the body's automatic signals to  store and hold urine longer, which will increase urgency/frequency.  In this case, the bladder "is running the show" and strategies can be learned to retrain this pattern.   One should be able to control the first urge to urinate, at around 118mL.  The bladder can hold up to a "grande latte," or 474mL. To help you gain control, practice the Urge Drill below when urgency strikes.  This drill will help retrain your bladder signals and allow you to store and hold urine longer.  The overall goal is to stretch out your time between voids to reach a more manageable voiding schedule.    Practice your "quick flicks" often throughout the day (each waking hour) even when you don't need feel the urge to go.  This will help strengthen your pelvic floor muscles, making them more effective in controlling leakage.  Urge Drill  When you feel an urge to go, follow these steps to regain control: Stop what you are doing and be still Take one deep breath, directing your air into your abdomen Think an affirming thought, such as "I've got this." Do 5 quick flicks of your pelvic floor Walk with control to the bathroom to void, or delay voiding  Moisturizers They are used in the vagina to hydrate the mucous membrane that make up the vaginal canal. Designed to keep a more normal acid balance (ph) Once placed in the vagina, it will last between two to three days.  Use 2-3 times per week at bedtime  Ingredients to avoid is glycerin and fragrance, can increase chance of infection Should not be used just before sex due to causing irritation  Most are gels administered either in a tampon-shaped applicator or as a vaginal suppository. They are non-hormonal.   Types of Moisturizers(internal use)  Vitamin E vaginal suppositories- Whole foods, Amazon Moist Again Coconut oil- can break down condoms Julva- (Do no use if on Tamoxifen) amazon Yes moisturizer- amazon NeuEve Silk , NeuEve Silver for menopausal or over 65 (if  have severe vaginal atrophy or cancer treatments use NeuEve Silk for  1 month than move to [employer])- [employer], [place].com Olive and Bee intimate cream- www.oliveandbee.com.au Mae vaginal moisturizer- Amazon Aloe Good Clean Love Hyaluronic acid Hyalofemme replens   Creams to use externally on the Vulva area [employer]'s (good for for cancer patients that had radiation to the area)- [place] or [employer].FlyingBasics.com.br V-magic cream - amazon Julva-amazon Vital "V [place] salve ( help moisturize and help with thinning vulvar area, does have Wright by Irwin Brakeman labial moisturizer (Amazon,  Coconut or olive oil aloe Good Clean Love Enchanted Rose by intimate rose  Things to avoid in the vaginal area Do not use things to irritate the vulvar area No lotions just specialized creams for the vulva area- Neogyn, V-magic, No soaps; can use Aveeno or Calendula cleanser if needed. Must be gentle No deodorants No douches Good to sleep without underwear to let the vaginal area to air out No scrubbing: spread the lips to let warm water rinse over labias and pat dry

## 2021-12-21 NOTE — Therapy (Signed)
OUTPATIENT PHYSICAL THERAPY FEMALE PELVIC EVALUATION   Patient Name: Sarah Ramos MRN: [id number] DOB:[birth year], 56 y.o., female Today's Date: 12/21/2021   PT End of Session - 12/21/21 1005     Visit Number 1    Date for PT Re-Evaluation 03/22/22    Authorization Type Aet[employer]    PT Start Time 0845    PT Stop Time 0926    PT Time Calculation (min) 41 min    Activity Tolerance Patient tolerated treatment well    Behavior During Therapy [facility] for tasks assessed/performed             Past Medical History:  Diagnosis Date   Arthritis    Chronic fatigue    CIN I (cervical intraepithelial neoplasia I) 03/28/2003   Colitis    Fibromyalgia    GERD (gastroesophageal reflux disease)    Headache    Skin rash    Ulcerative colitis (HCC)    Past Surgical History:  Procedure Laterality Date   CERVICAL DISC SURGERY     CHOLECYSTECTOMY     GYNECOLOGIC CRYOSURGERY  1991   CIN 1   INTRAUTERINE DEVICE INSERTION  01-2006, 02/2011, 05/05/2016   Mirena   KNEE ARTHROSCOPY WITH MEDIAL PATELLAR FEMORAL LIGAMENT RECONSTRUCTION Left 07/25/2021   Procedure: KNEE ARTHROSCOPY WITH REMOVAL OF FOREIGN BODY  MEDIAL PATELLAR FEMORAL LIGAMENT RECONSTRUCTION WITH ALLOGRAFT;  Surgeon: Yolonda Kida, MD;  Location: [facility];  Service: Orthopedics;  Laterality: Left;  120   Patient Active Problem List   Diagnosis Date Noted   S/P arthroscopic surgery of left knee 07/26/2021   Patellar instability of left knee 07/25/2021   Fatigue 01/20/2016   Insomnia 01/20/2016   Trapezius muscle spasm 01/20/2016   Fibromyalgia 07/03/2012   Colitis     PCP: Scifres, Peter Minium  REFERRING PROVIDER: Olivia Mackie, NP  REFERRING DIAG: R35.1 (ICD-10-CM) - Nocturia R39.15 (ICD-10-CM) - Urinary urgency N81.89 (ICD-10-CM) - Pelvic floor weakness in female  THERAPY DIAG:  Muscle weakness (generalized) - Plan: PT plan of care cert/re-cert  Abnormal posture - Plan: PT plan of care  cert/re-cert  Unspecified lack of coordination - Plan: PT plan of care cert/re-cert  Rationale for Evaluation and Treatment Rehabilitation  ONSET DATE: lifelong increased frequency, but has been worsening with age  SUBJECTIVE:                                                                                                                                                                                           SUBJECTIVE STATEMENT: Pt reports she has a lot of trouble with nighttime urination frequency up to 5-6x. Normally does not have  leakage but unless she was "way far away from bathroom".  Did have knee surgery on Lt knee for ligament repair 05/2021.   Fluid intake: Yes: 60 oz; sometimes other drinks but mostly water     PAIN:  Are you having pain? No   PRECAUTIONS: None  WEIGHT BEARING RESTRICTIONS No  FALLS:  Has patient fallen in last 6 months? Yes. 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  PATIENT GOALS to have less urgency and frequency   PERTINENT HISTORY:  Patellar instability of the left knee Admission Diagnoses: Status post left knee arthroscopy with MPFL reconstruction; CIN I, fibromyalgia, Ulcerative colitis Sexual abuse: No  BOWEL MOVEMENT Pain with bowel movement: No Type of bowel movement:Type (Bristol Stool Scale) 4, Frequency daily, and Strain No Fully empty rectum: Yes:   Leakage: No Pads: No Fiber supplement: No  URINATION Pain with urination: No Fully empty bladder: Yes: but sits for a little longer to make sure fully empty and doesn't rush Stream: Strong Urgency: Yes:   Frequency: every 2 hours during day, 5-6x per night Leakage:  none Pads: No  INTERCOURSE Pain with intercourse: During  Penetration Ability to have vaginal penetration:  Yes:   Climax: not painful Marinoff Scale: 1/3  PREGNANCY Vaginal deliveries 1 Tearing Yes: forceps  C-section deliveries 0 Currently pregnant No  PROLAPSE None    OBJECTIVE:   DIAGNOSTIC FINDINGS:    COGNITION:  Overall cognitive status: Within functional limits for tasks assessed     SENSATION:  Light touch: Appears intact  Proprioception: Appears intact  MUSCLE LENGTH: Bil hamstrings and adductors limited by 25%                POSTURE: rounded shoulders, forward head, and posterior pelvic tilt  LUMBARAROM/PROM  A/PROM A/PROM  eval  Flexion Decreased by 25%  Extension WFL  Right lateral flexion WFL  Left lateral flexion WFL  Right rotation Decreased by 25%  Left rotation Decreased by 25%   (Blank rows = not tested)  LOWER EXTREMITY ROM:  WFL  LOWER EXTREMITY MMT:  Bil hips grossly 4/5, RT knee 5/5, Lt knee 4/5, ankles 5/5   PALPATION:   General  no TTP                 External Perineal Exam mild dryness but otherwise [facility]                             Internal Pelvic Floor mild TTP at Rt obturator internus  Patient confirms identification and approves PT to assess internal pelvic floor and treatment Yes  PELVIC MMT:   MMT eval  Vaginal 2/5 however great difficulty with technique initially with demonstrating pushing downward with attempts to contract pelvic floor, needed max VC for 2 reps of no downward pressure and slight squeeze  Internal Anal Sphincter   External Anal Sphincter   Puborectalis   Diastasis Recti   (Blank rows = not tested)        TONE: Mildly decreased  PROLAPSE: Not seen in hooklying   TODAY'S TREATMENT  EVAL Examination completed, findings reviewed, pt educated on POC, bladder irritants, moisturizers, and urge drill. All reviewed in session. Pt motivated to participate in PT and agreeable  to attempt recommendations.     PATIENT EDUCATION:  Education details: bladder  irritants, moisturizers, and urge drill Person educated: Patient Education method: Explanation, Demonstration, Tactile cues, Verbal cues, and Handouts Education comprehension: verbalized understanding and returned demonstration   HOME EXERCISE PROGRAM: bladder irritants, moisturizers, and urge drill  ASSESSMENT:  CLINICAL IMPRESSION: Patient is a 56 y.o. female  who was seen today for physical therapy evaluation and treatment for increased urinary frequency, urgency and pelvic floor weakness. Pt found to have slightly decreased spinal flexibility, mild bil hip weakness and core weakness, decreased hip flexibility. Pt consented to internal vaginal assessment this date and found to have decreased strength, coordination, and endurance and needed max verbal cues for improved technique to prevent/limit downward pressure at pelvic floor with attempts to contract, with cues able to have less pressure downward and slight squeeze 2/5 for 2 reps. Pt educated on urge drill, bladder irritants, and moisturizers. Pt would benefit from additional PT to further address deficits.        OBJECTIVE IMPAIRMENTS decreased coordination, decreased endurance, decreased strength, increased fascial restrictions, increased muscle spasms, impaired flexibility, improper body mechanics, and postural dysfunction.   ACTIVITY LIMITATIONS continence and locomotion level  PARTICIPATION LIMITATIONS: interpersonal relationship, community activity, and occupation  PERSONAL FACTORS Past/current experiences and Time since onset of injury/illness/exacerbation are also affecting patient's functional outcome.   REHAB POTENTIAL: Good  CLINICAL DECISION MAKING: Stable/uncomplicated  EVALUATION COMPLEXITY: Low   GOALS: Goals reviewed with patient? Yes  SHORT TERM GOALS: Target date: 01/18/2022  Pt to be I with HEP.  Baseline: Goal status: INITIAL  2.  Pt will have 25% less urgency due to bladder retraining and  strengthening  Baseline:  Goal status: INITIAL  3.  Pt to report improved time between bladder voids to at least 2.5 hours during the day and only getting up 3-4x per night for improved QOL with decreased urinary frequency.   Baseline: 5-6x/night, 2 hours daily Goal status: INITIAL  4.  Pt to demonstrate at least 3/5 pelvic floor strength without compensatory strategies for improved pelvic stability and decreased strain at pelvic floor/ decrease leakage.  Baseline:  Goal status: INITIAL   LONG TERM GOALS: Target date: 03/22/22   Pt to be I with advanced HEP.  Baseline:  Goal status: INITIAL  2.  Pt will have 50% less urgency due to bladder retraining and strengthening  Baseline:  Goal status: INITIAL  3.  Pt to report improved time between bladder voids to at least 3 hours during the day and only getting up at night 2-3x for improved QOL with decreased urinary frequency.   Baseline:  Goal status: INITIAL  4.  Pt to demonstrate at least 4/5 pelvic floor strength for improved pelvic stability and decreased strain at pelvic floor/ decrease leakage.  Baseline:  Goal status: INITIAL  5.  Pt to be I with diaphragmatic breathing and voiding mechanics for improved bladder habits.  Baseline:  Goal status: INITIAL  PLAN: PT FREQUENCY: 1x/week  PT DURATION:  8 sessions  PLANNED INTERVENTIONS: Therapeutic exercises, Therapeutic activity, Neuromuscular re-education, Patient/Family education, Self Care, Joint mobilization, Dry Needling, Spinal mobilization, Cryotherapy, Moist heat, scar mobilization, Taping, Biofeedback, and Manual therapy  PLAN FOR NEXT SESSION: go over handouts, give HEP, core/hip strengthening with coordination of pelvic floor and breathing mechanics; urge drill in more detail as needed; internal if needed for improved coordination of pelvic floor    Otelia Sergeant, PT, DPT 12/21/2308:06 AM

## 2021-12-26 ENCOUNTER — Ambulatory Visit: Payer: No Typology Code available for payment source | Attending: Nurse Practitioner | Admitting: Physical Therapy

## 2021-12-26 DIAGNOSIS — R293 Abnormal posture: Secondary | ICD-10-CM | POA: Diagnosis present

## 2021-12-26 DIAGNOSIS — R279 Unspecified lack of coordination: Secondary | ICD-10-CM | POA: Insufficient documentation

## 2021-12-26 DIAGNOSIS — M6281 Muscle weakness (generalized): Secondary | ICD-10-CM | POA: Diagnosis present

## 2021-12-26 NOTE — Therapy (Signed)
OUTPATIENT PHYSICAL THERAPY FEMALE PELVIC EVALUATION   Patient Name: TAMERRA MERKLEY MRN: [id number] DOB:06/08/1965, 56 y.o., female Today's Date: 12/26/2021   PT End of Session - 12/26/21 1238     Visit Number 2    Date for PT Re-Evaluation 03/22/22    Authorization Type [employer]    PT Start Time 1237   pt arrival time   PT Stop Time 1315    PT Time Calculation (min) 38 min    Activity Tolerance Patient tolerated treatment well    Behavior During Therapy [facility] for tasks assessed/performed             Past Medical History:  Diagnosis Date   Arthritis    Chronic fatigue    CIN I (cervical intraepithelial neoplasia I) 03/28/2003   Colitis    Fibromyalgia    GERD (gastroesophageal reflux disease)    Headache    Skin rash    Ulcerative colitis ([place])    Past Surgical History:  Procedure Laterality Date   CERVICAL DISC SURGERY     CHOLECYSTECTOMY     GYNECOLOGIC CRYOSURGERY  1991   CIN 1   INTRAUTERINE DEVICE INSERTION  01-2006, 02/2011, 05/05/2016   Mirena   KNEE ARTHROSCOPY WITH MEDIAL PATELLAR FEMORAL LIGAMENT RECONSTRUCTION Left 07/25/2021   Procedure: KNEE ARTHROSCOPY WITH REMOVAL OF FOREIGN BODY  MEDIAL PATELLAR FEMORAL LIGAMENT RECONSTRUCTION WITH ALLOGRAFT;  Surgeon: Nicholes Stairs, MD;  Location: [facility];  Service: Orthopedics;  Laterality: Left;  120   Patient Active Problem List   Diagnosis Date Noted   S/P arthroscopic surgery of left knee 07/26/2021   Patellar instability of left knee 07/25/2021   Fatigue 01/20/2016   Insomnia 01/20/2016   Trapezius muscle spasm 01/20/2016   Fibromyalgia 07/03/2012   Colitis     PCP: Scifres, Durel Salts  REFERRING PROVIDER: Tamela Gammon, NP  REFERRING DIAG: R35.1 (ICD-10-CM) - Nocturia R39.15 (ICD-10-CM) - Urinary urgency N81.89 (ICD-10-CM) - Pelvic floor weakness in female  THERAPY DIAG:  Muscle weakness (generalized)  Unspecified lack of coordination  Abnormal posture  Rationale for Evaluation  and Treatment Rehabilitation  ONSET DATE: lifelong increased frequency, but has been worsening with age  SUBJECTIVE:                                                                                                                                                                                           SUBJECTIVE STATEMENT: Pt reports she continues to get up 5-6x per night.   Fluid intake: Yes: 60 oz; sometimes other drinks but mostly water     PAIN:  Are you having pain? No  PRECAUTIONS: None  WEIGHT BEARING RESTRICTIONS No  FALLS:  Has patient fallen in last 6 months? Yes. 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  PATIENT GOALS to have less urgency and frequency   PERTINENT HISTORY:  Patellar instability of the left knee Admission Diagnoses: Status post left knee arthroscopy with MPFL reconstruction; CIN I, fibromyalgia, Ulcerative colitis Sexual abuse: No  BOWEL MOVEMENT Pain with bowel movement: No Type of bowel movement:Type (Bristol Stool Scale) 4, Frequency daily, and Strain No Fully empty rectum: Yes:   Leakage: No Pads: No Fiber supplement: No  URINATION Pain with urination: No Fully empty bladder: Yes: but sits for a little longer to make sure fully empty and doesn't rush Stream: Strong Urgency: Yes:   Frequency: every 2 hours during day, 5-6x per night Leakage:  none Pads: No  INTERCOURSE Pain with intercourse: During Penetration Ability to have vaginal penetration:  Yes:   Climax: not painful Marinoff Scale: 1/3  PREGNANCY Vaginal deliveries 1 Tearing Yes: forceps  C-section deliveries 0 Currently pregnant No  PROLAPSE None    OBJECTIVE:   DIAGNOSTIC FINDINGS:    COGNITION:  Overall cognitive  status: Within functional limits for tasks assessed     SENSATION:  Light touch: Appears intact  Proprioception: Appears intact  MUSCLE LENGTH: Bil hamstrings and adductors limited by 25%                POSTURE: rounded shoulders, forward head, and posterior pelvic tilt  LUMBARAROM/PROM  A/PROM A/PROM  eval  Flexion Decreased by 25%  Extension WFL  Right lateral flexion WFL  Left lateral flexion WFL  Right rotation Decreased by 25%  Left rotation Decreased by 25%   (Blank rows = not tested)  LOWER EXTREMITY ROM:  WFL  LOWER EXTREMITY MMT:  Bil hips grossly 4/5, RT knee 5/5, Lt knee 4/5, ankles 5/5   PALPATION:   General  no TTP                 External Perineal Exam mild dryness but otherwise [facility]                             Internal Pelvic Floor mild TTP at Rt obturator internus  Patient confirms identification and approves PT to assess internal pelvic floor and treatment Yes  PELVIC MMT:   MMT eval  Vaginal 2/5 however great difficulty with technique initially with demonstrating pushing downward with attempts to contract pelvic floor, needed max VC for 2 reps of no downward pressure and slight squeeze  Internal Anal Sphincter   External Anal Sphincter   Puborectalis   Diastasis Recti   (Blank rows = not tested)        TONE: Mildly decreased  PROLAPSE: Not seen in hooklying   TODAY'S TREATMENT  12/26/2021: NMRE: all exercises cued for breathing and pelvic floor coordination and improved core and pelvic floor activation  2x10 bridges with ball squeezes 2x10 opp hand/knee ball press 2x10 Sit to stand from mat table  X10 mini squats 15# Mare Ferrari carry 15# 500' Green band palloffs x10, rotational palloffs x10 each Blue band standing in place marching with anti rotation x10 each   EVAL Examination completed, findings reviewed, pt educated on POC,  bladder irritants, moisturizers, and urge drill. All reviewed in session. Pt motivated to participate in PT and  agreeable to attempt recommendations.     PATIENT EDUCATION:  Education details: bladder irritants, moisturizers, and urge drill Person educated: Patient Education method: Explanation, Demonstration, Tactile cues, Verbal cues, and Handouts Education comprehension: verbalized understanding and returned demonstration   HOME EXERCISE PROGRAM: bladder irritants, moisturizers, and urge drill  ASSESSMENT:  CLINICAL IMPRESSION: Patient presents for treatment, similar symptoms to eval. Urge drill hasn't started improving urgency yet but pt reports she is still trying it. Session focused on hip and core strengthening with coordination of pelvic floor and breathing mechanics with all exercises. Pt benefited from moderate verbal cues and tactile cues for coordination and improved core and pelvic floor activation. Pt would benefit from additional PT to further address deficits.        OBJECTIVE IMPAIRMENTS decreased coordination, decreased endurance, decreased strength, increased fascial restrictions, increased muscle spasms, impaired flexibility, improper body mechanics, and postural dysfunction.   ACTIVITY LIMITATIONS continence and locomotion level  PARTICIPATION LIMITATIONS: interpersonal relationship, community activity, and occupation  PERSONAL FACTORS Past/current experiences and Time since onset of injury/illness/exacerbation are also affecting patient's functional outcome.   REHAB POTENTIAL: Good  CLINICAL DECISION MAKING: Stable/uncomplicated  EVALUATION COMPLEXITY: Low   GOALS: Goals reviewed with patient? Yes  SHORT TERM GOALS: Target date: 01/18/2022  Pt to be I with HEP.  Baseline: Goal status: INITIAL  2.  Pt will have 25% less urgency due to bladder retraining and strengthening  Baseline:  Goal status: INITIAL  3.  Pt to report improved time between bladder voids to at least 2.5 hours during the day and only getting up 3-4x per night for improved QOL with decreased  urinary frequency.   Baseline: 5-6x/night, 2 hours daily Goal status: INITIAL  4.  Pt to demonstrate at least 3/5 pelvic floor strength without compensatory strategies for improved pelvic stability and decreased strain at pelvic floor/ decrease leakage.  Baseline:  Goal status: INITIAL   LONG TERM GOALS: Target date: 03/22/22   Pt to be I with advanced HEP.  Baseline:  Goal status: INITIAL  2.  Pt will have 50% less urgency due to bladder retraining and strengthening  Baseline:  Goal status: INITIAL  3.  Pt to report improved time between bladder voids to at least 3 hours during the day and only getting up at night 2-3x for improved QOL with decreased urinary frequency.   Baseline:  Goal status: INITIAL  4.  Pt to demonstrate at least 4/5 pelvic floor strength for improved pelvic stability and decreased strain at pelvic floor/ decrease leakage.  Baseline:  Goal status: INITIAL  5.  Pt to be I with diaphragmatic breathing and voiding mechanics for improved bladder habits.  Baseline:  Goal status: INITIAL  PLAN: PT FREQUENCY: 1x/week  PT DURATION:  8 sessions  PLANNED INTERVENTIONS: Therapeutic exercises, Therapeutic activity, Neuromuscular re-education, Patient/Family education, Self Care, Joint mobilization, Dry Needling, Spinal mobilization, Cryotherapy, Moist heat, scar mobilization, Taping, Biofeedback, and Manual therapy  PLAN FOR NEXT SESSION: go over handouts, give HEP, core/hip strengthening with coordination of pelvic floor and breathing mechanics; urge drill in more detail as needed; internal if needed for improved coordination of pelvic floor    Stacy Gardner, PT, DPT 10/02/231:17 PM

## 2022-01-04 ENCOUNTER — Encounter: Payer: Self-pay | Admitting: Physical Therapy

## 2022-01-11 ENCOUNTER — Ambulatory Visit: Payer: No Typology Code available for payment source | Admitting: Physical Therapy

## 2022-01-25 ENCOUNTER — Ambulatory Visit: Payer: No Typology Code available for payment source | Admitting: Physical Therapy

## 2022-02-01 ENCOUNTER — Ambulatory Visit: Payer: No Typology Code available for payment source | Admitting: Physical Therapy

## 2022-02-09 ENCOUNTER — Ambulatory Visit: Payer: No Typology Code available for payment source | Attending: Nurse Practitioner | Admitting: Physical Therapy

## 2022-02-09 DIAGNOSIS — M6281 Muscle weakness (generalized): Secondary | ICD-10-CM | POA: Insufficient documentation

## 2022-02-09 DIAGNOSIS — R293 Abnormal posture: Secondary | ICD-10-CM | POA: Diagnosis present

## 2022-02-09 DIAGNOSIS — R279 Unspecified lack of coordination: Secondary | ICD-10-CM | POA: Diagnosis present

## 2022-02-09 NOTE — Therapy (Signed)
OUTPATIENT PHYSICAL THERAPY FEMALE PELVIC EVALUATION   Patient Name: AREANNA STONIER MRN: [id number] DOB:Jan 10, 1966, 56 y.o., female Today's Date: 02/09/2022   PT End of Session - 02/09/22 1444     Visit Number 3    Date for PT Re-Evaluation 03/22/22    Authorization Type [employer]et[employer]    PT Start Time 1445    PT Stop Time 1523    PT Time Calculation (min) 38 min    Activity Tolerance Patient tolerated treatment well    Behavior During Therapy [facility] for tasks assessed/performed             Past Medical History:  Diagnosis Date   Arthritis    Chronic fatigue    CIN I (cervical intraepithelial neoplasia I) 03/28/2003   Colitis    Fibromyalgia    GERD (gastroesophageal reflux disease)    Headache    Skin rash    Ulcerative colitis ([place])    Past Surgical History:  Procedure Laterality Date   CERVICAL DISC SURGERY     CHOLECYSTECTOMY     GYNECOLOGIC CRYOSURGERY  1991   CIN 1   INTRAUTERINE DEVICE INSERTION  01-2006, 02/2011, 05/05/2016   Mirena   KNEE ARTHROSCOPY WITH MEDIAL PATELLAR FEMORAL LIGAMENT RECONSTRUCTION Left 07/25/2021   Procedure: KNEE ARTHROSCOPY WITH REMOVAL OF FOREIGN BODY  MEDIAL PATELLAR FEMORAL LIGAMENT RECONSTRUCTION WITH ALLOGRAFT;  Surgeon: Nicholes Stairs, MD;  Location: [facility];  Service: Orthopedics;  Laterality: Left;  120   Patient Active Problem List   Diagnosis Date Noted   S/P arthroscopic surgery of left knee 07/26/2021   Patellar instability of left knee 07/25/2021   Fatigue 01/20/2016   Insomnia 01/20/2016   Trapezius muscle spasm 01/20/2016   Fibromyalgia 07/03/2012   Colitis     PCP: Scifres, Durel Salts  REFERRING PROVIDER: Tamela Gammon, NP  REFERRING DIAG: R35.1 (ICD-10-CM) - Nocturia R39.15 (ICD-10-CM) - Urinary urgency N81.89 (ICD-10-CM) - Pelvic floor weakness in female  THERAPY DIAG:  Muscle weakness (generalized)  Unspecified lack of coordination  Abnormal posture  Rationale for Evaluation and Treatment  Rehabilitation  ONSET DATE: lifelong increased frequency, but has been worsening with age  SUBJECTIVE:                                                                                                                                                                                           SUBJECTIVE STATEMENT: Pt reports she gets up 3-5x time per night now, also cut back on nighttime drinking and limits drinking before better. Pt reports she has not been able to do exercises as much as she wanted but seeing small improvement.  Fluid intake: Yes: 60 oz; sometimes other drinks but mostly water     PAIN:  Are you having pain? No   PRECAUTIONS: None  WEIGHT BEARING RESTRICTIONS No  FALLS:  Has patient fallen in last 6 months? Yes. 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  PATIENT GOALS to have less urgency and frequency   PERTINENT HISTORY:  Patellar instability of the left knee Admission Diagnoses: Status post left knee arthroscopy with MPFL reconstruction; CIN I, fibromyalgia, Ulcerative colitis Sexual abuse: No  BOWEL MOVEMENT Pain with bowel movement: No Type of bowel movement:Type (Bristol Stool Scale) 4, Frequency daily, and Strain No Fully empty rectum: Yes:   Leakage: No Pads: No Fiber supplement: No  URINATION Pain with urination: No Fully empty bladder: Yes: but sits for a little longer to make sure fully empty and doesn't rush Stream: Strong Urgency: Yes:   Frequency: every 2 hours during day, 5-6x per night Leakage:  none Pads: No  INTERCOURSE Pain with intercourse: During Penetration Ability to have vaginal penetration:  Yes:   Climax: not painful Marinoff Scale: 1/3  PREGNANCY Vaginal deliveries 1 Tearing Yes:  forceps  C-section deliveries 0 Currently pregnant No  PROLAPSE None    OBJECTIVE:   DIAGNOSTIC FINDINGS:    COGNITION:  Overall cognitive status: Within functional limits for tasks assessed     SENSATION:  Light touch: Appears intact  Proprioception: Appears intact  MUSCLE LENGTH: Bil hamstrings and adductors limited by 25%                POSTURE: rounded shoulders, forward head, and posterior pelvic tilt  LUMBARAROM/PROM  A/PROM A/PROM  eval  Flexion Decreased by 25%  Extension WFL  Right lateral flexion WFL  Left lateral flexion WFL  Right rotation Decreased by 25%  Left rotation Decreased by 25%   (Blank rows = not tested)  LOWER EXTREMITY ROM:  WFL  LOWER EXTREMITY MMT:  Bil hips grossly 4/5, RT knee 5/5, Lt knee 4/5, ankles 5/5   PALPATION:   General  no TTP                 External Perineal Exam mild dryness but otherwise [facility]                             Internal Pelvic Floor mild TTP at Rt obturator internus  Patient confirms identification and approves PT to assess internal pelvic floor and treatment Yes  PELVIC MMT:   MMT eval  Vaginal 2/5 however great difficulty with technique initially with demonstrating pushing downward with attempts to contract pelvic floor, needed max VC for 2 reps of no downward pressure and slight squeeze  Internal Anal Sphincter   External Anal Sphincter   Puborectalis   Diastasis Recti   (Blank rows = not tested)        TONE: Mildly decreased  PROLAPSE: Not seen in hooklying   TODAY'S TREATMENT  02/09/22:  NMRE: all exercises cued for breathing and pelvic floor coordination and improved core and pelvic floor activation  2x10 bridges with ball squeezes 2x10 opp hand/knee ball press 2X10 mini squats 15# Marching 10# at shoulder height 2x10 Mare Ferrari carry 15#  +10# 300' x2 blue band palloffs x10,  rotational palloffs x10 each 2x30s modified plank at mat table with one purple foam    PATIENT EDUCATION:   Education details: bladder irritants, moisturizers, and urge drill; [id number] Person educated: Patient Education method: Explanation, Demonstration, Tactile cues, Verbal cues, and Handouts Education comprehension: verbalized understanding and returned demonstration   HOME EXERCISE PROGRAM: bladder irritants, moisturizers, and urge drill; DNWHACYV  ASSESSMENT:  CLINICAL IMPRESSION: Patient presents for treatment, starting to see improvement with frequency and urgency slightly. Session focused on hip and core strengthening with coordination of pelvic floor and breathing mechanics with all exercises. Pt benefited from minimal verbal cues and tactile cues for coordination and improved core and pelvic floor activation. Pt would benefit from additional PT to further address deficits.        OBJECTIVE IMPAIRMENTS decreased coordination, decreased endurance, decreased strength, increased fascial restrictions, increased muscle spasms, impaired flexibility, improper body mechanics, and postural dysfunction.   ACTIVITY LIMITATIONS continence and locomotion level  PARTICIPATION LIMITATIONS: interpersonal relationship, community activity, and occupation  PERSONAL FACTORS Past/current experiences and Time since onset of injury/illness/exacerbation are also affecting patient's functional outcome.   REHAB POTENTIAL: Good  CLINICAL DECISION MAKING: Stable/uncomplicated  EVALUATION COMPLEXITY: Low   GOALS: Goals reviewed with patient? Yes  SHORT TERM GOALS: Target date: 01/18/2022  Pt to be I with HEP.  Baseline: Goal status: INITIAL  2.  Pt will have 25% less urgency due to bladder retraining and strengthening  Baseline:  Goal status: INITIAL  3.  Pt to report improved time between bladder voids to at least 2.5 hours during the day and only getting up 3-4x per night for improved QOL with decreased urinary frequency.   Baseline: 5-6x/night, 2 hours daily Goal status: INITIAL  4.  Pt to  demonstrate at least 3/5 pelvic floor strength without compensatory strategies for improved pelvic stability and decreased strain at pelvic floor/ decrease leakage.  Baseline:  Goal status: INITIAL   LONG TERM GOALS: Target date: 03/22/22   Pt to be I with advanced HEP.  Baseline:  Goal status: INITIAL  2.  Pt will have 50% less urgency due to bladder retraining and strengthening  Baseline:  Goal status: INITIAL  3.  Pt to report improved time between bladder voids to at least 3 hours during the day and only getting up at night 2-3x for improved QOL with decreased urinary frequency.   Baseline:  Goal status: INITIAL  4.  Pt to demonstrate at least 4/5 pelvic floor strength for improved pelvic stability and decreased strain at pelvic floor/ decrease leakage.  Baseline:  Goal status: INITIAL  5.  Pt to be I with diaphragmatic breathing and voiding mechanics for improved bladder habits.  Baseline:  Goal status: INITIAL  PLAN: PT FREQUENCY: 1x/week  PT DURATION:  8 sessions  PLANNED INTERVENTIONS: Therapeutic exercises, Therapeutic activity, Neuromuscular re-education, Patient/Family education, Self Care, Joint mobilization, Dry Needling, Spinal mobilization, Cryotherapy, Moist heat, scar mobilization, Taping, Biofeedback, and Manual therapy  PLAN FOR NEXT SESSION: go over handouts, give HEP, core/hip strengthening with coordination of pelvic floor and breathing mechanics; urge drill in more detail as needed; internal if needed for improved coordination of pelvic floor    Otelia Sergeant, PT, DPT 11/16/233:36 PM

## 2022-02-14 ENCOUNTER — Ambulatory Visit: Payer: No Typology Code available for payment source | Admitting: Physical Therapy

## 2022-02-23 ENCOUNTER — Encounter: Payer: No Typology Code available for payment source | Admitting: Physical Therapy

## 2022-03-01 ENCOUNTER — Ambulatory Visit: Payer: No Typology Code available for payment source | Attending: Nurse Practitioner | Admitting: Physical Therapy

## 2022-03-01 DIAGNOSIS — R293 Abnormal posture: Secondary | ICD-10-CM | POA: Insufficient documentation

## 2022-03-01 DIAGNOSIS — R279 Unspecified lack of coordination: Secondary | ICD-10-CM | POA: Insufficient documentation

## 2022-03-01 DIAGNOSIS — M6281 Muscle weakness (generalized): Secondary | ICD-10-CM | POA: Insufficient documentation

## 2022-03-01 NOTE — Therapy (Signed)
OUTPATIENT PHYSICAL THERAPY FEMALE PELVIC EVALUATION   Patient Name: Sarah Ramos MRN: [id number] DOB:[birth year], 56 y.o., female Today's Date: 03/01/2022   PT End of Session - 03/01/22 1532     Visit Number 4    Date for PT Re-Evaluation 03/22/22    Authorization Type [employer]    PT Start Time 1530    PT Stop Time 1608    PT Time Calculation (min) 38 min    Activity Tolerance Patient tolerated treatment well    Behavior During Therapy [facility] for tasks assessed/performed             Past Medical History:  Diagnosis Date   Arthritis    Chronic fatigue    CIN I (cervical intraepithelial neoplasia I) 03/28/2003   Colitis    Fibromyalgia    GERD (gastroesophageal reflux disease)    Headache    Skin rash    Ulcerative colitis ([place])    Past Surgical History:  Procedure Laterality Date   CERVICAL DISC SURGERY     CHOLECYSTECTOMY     GYNECOLOGIC CRYOSURGERY  1991   CIN 1   INTRAUTERINE DEVICE INSERTION  01-2006, 02/2011, 05/05/2016   Mirena   KNEE ARTHROSCOPY WITH MEDIAL PATELLAR FEMORAL LIGAMENT RECONSTRUCTION Left 07/25/2021   Procedure: KNEE ARTHROSCOPY WITH REMOVAL OF FOREIGN BODY  MEDIAL PATELLAR FEMORAL LIGAMENT RECONSTRUCTION WITH ALLOGRAFT;  Surgeon: Nicholes Stairs, MD;  Location: [facility];  Service: Orthopedics;  Laterality: Left;  120   Patient Active Problem List   Diagnosis Date Noted   S/P arthroscopic surgery of left knee 07/26/2021   Patellar instability of left knee 07/25/2021   Fatigue 01/20/2016   Insomnia 01/20/2016   Trapezius muscle spasm 01/20/2016   Fibromyalgia 07/03/2012   Colitis     PCP: Scifres, Durel Salts  REFERRING PROVIDER: Tamela Gammon, NP  REFERRING DIAG: R35.1 (ICD-10-CM) - Nocturia R39.15 (ICD-10-CM) - Urinary urgency N81.89 (ICD-10-CM) - Pelvic floor weakness in female  THERAPY DIAG:  Unspecified lack of coordination  Muscle weakness (generalized)  Abnormal posture  Rationale for Evaluation and Treatment  Rehabilitation  ONSET DATE: lifelong increased frequency, but has been worsening with age  SUBJECTIVE:                                                                                                                                                                                           SUBJECTIVE STATEMENT: Pt reports she has been doing urge drill at night and decreased water intake right before bed and now 2-3x per night on average and sometimes only 1x.   Fluid intake: Yes: 60 oz; sometimes other drinks but mostly  water     PAIN:  Are you having pain? No   PRECAUTIONS: None  WEIGHT BEARING RESTRICTIONS No  FALLS:  Has patient fallen in last 6 months? Yes. 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  PATIENT GOALS to have less urgency and frequency   PERTINENT HISTORY:  Patellar instability of the left knee Admission Diagnoses: Status post left knee arthroscopy with MPFL reconstruction; CIN I, fibromyalgia, Ulcerative colitis Sexual abuse: No  BOWEL MOVEMENT Pain with bowel movement: No Type of bowel movement:Type (Bristol Stool Scale) 4, Frequency daily, and Strain No Fully empty rectum: Yes:   Leakage: No Pads: No Fiber supplement: No  URINATION Pain with urination: No Fully empty bladder: Yes: but sits for a little longer to make sure fully empty and doesn't rush Stream: Strong Urgency: Yes:   Frequency: every 2 hours during day, 5-6x per night Leakage:  none Pads: No  INTERCOURSE Pain with intercourse: During Penetration Ability to have vaginal penetration:  Yes:   Climax: not painful Marinoff Scale: 1/3  PREGNANCY Vaginal deliveries 1 Tearing Yes: forceps  C-section deliveries 0 Currently pregnant  No  PROLAPSE None    OBJECTIVE:   DIAGNOSTIC FINDINGS:    COGNITION:  Overall cognitive status: Within functional limits for tasks assessed     SENSATION:  Light touch: Appears intact  Proprioception: Appears intact  MUSCLE LENGTH: Bil hamstrings and adductors limited by 25%                POSTURE: rounded shoulders, forward head, and posterior pelvic tilt  LUMBARAROM/PROM  A/PROM A/PROM  eval  Flexion Decreased by 25%  Extension WFL  Right lateral flexion WFL  Left lateral flexion WFL  Right rotation Decreased by 25%  Left rotation Decreased by 25%   (Blank rows = not tested)  LOWER EXTREMITY ROM:  WFL  LOWER EXTREMITY MMT:  Bil hips grossly 4/5, RT knee 5/5, Lt knee 4/5, ankles 5/5   PALPATION:   General  no TTP                 External Perineal Exam mild dryness but otherwise [facility]                             Internal Pelvic Floor mild TTP at Rt obturator internus  Patient confirms identification and approves PT to assess internal pelvic floor and treatment Yes  PELVIC MMT:   MMT eval  Vaginal 2/5 however great difficulty with technique initially with demonstrating pushing downward with attempts to contract pelvic floor, needed max VC for 2 reps of no downward pressure and slight squeeze  Internal Anal Sphincter   External Anal Sphincter   Puborectalis   Diastasis Recti   (Blank rows = not tested)        TONE: Mildly decreased  PROLAPSE: Not seen in hooklying   TODAY'S TREATMENT  03/01/22:  NMRE: all exercises cued for breathing and pelvic floor coordination and improved core and pelvic floor activation  2x10 bridges with ball squeezes 2x10 opp hand/knee ball press 2X10 Sit to stand from mat table 15# Mario punches 6# 2x10 each Mare Ferrari carry 15#  +10# 300' x2 Diagonals 10# x10 each Dead lifts 15# 2x10  PATIENT EDUCATION:  Education details: bladder irritants, moisturizers, and urge drill; DNWHACYV Person educated:  Patient Education method: Explanation, Demonstration, Tactile cues, Verbal cues, and Handouts Education comprehension: verbalized understanding and returned demonstration   HOME EXERCISE PROGRAM: bladder irritants, moisturizers, and urge drill; DNWHACYV  ASSESSMENT:  CLINICAL IMPRESSION: Patient reports she has noted improvement with urgency and frequency and pleased with this. Pt session focused on coordination of pelvic floor and breathing mechanics with hip and core strengthening. Pt tolerated well and benefited from minimal cues for technique. Pt would benefit from additional PT to further address deficits.        OBJECTIVE IMPAIRMENTS decreased coordination, decreased endurance, decreased strength, increased fascial restrictions, increased muscle spasms, impaired flexibility, improper body mechanics, and postural dysfunction.   ACTIVITY LIMITATIONS continence and locomotion level  PARTICIPATION LIMITATIONS: interpersonal relationship, community activity, and occupation  PERSONAL FACTORS Past/current experiences and Time since onset of injury/illness/exacerbation are also affecting patient's functional outcome.   REHAB POTENTIAL: Good  CLINICAL DECISION MAKING: Stable/uncomplicated  EVALUATION COMPLEXITY: Low   GOALS: Goals reviewed with patient? Yes  SHORT TERM GOALS: Target date: 01/18/2022  Pt to be I with HEP.  Baseline: Goal status: MET  2.  Pt will have 25% less urgency due to bladder retraining and strengthening  Baseline:  Goal status: MET  3.  Pt to report improved time between bladder voids to at least 2.5 hours during the day and only getting up 3-4x per night for improved QOL with decreased urinary frequency.   Baseline: 5-6x/night, 2 hours daily Goal status: MET  4.  Pt to demonstrate at least 3/5 pelvic floor strength without compensatory strategies for improved pelvic stability and decreased strain at pelvic floor/ decrease leakage.  Baseline:  Goal  status: on going   LONG TERM GOALS: Target date: 03/22/22   Pt to be I with advanced HEP.  Baseline:  Goal status: INITIAL  2.  Pt will have 50% less urgency due to bladder retraining and strengthening  Baseline:  Goal status: INITIAL  3.  Pt to report improved time between bladder voids to at least 3 hours during the day and only getting up at night 2-3x for improved QOL with decreased urinary frequency.   Baseline:  Goal status: INITIAL  4.  Pt to demonstrate at least 4/5 pelvic floor strength for improved pelvic stability and decreased strain at pelvic floor/ decrease leakage.  Baseline:  Goal status: INITIAL  5.  Pt to be I with diaphragmatic breathing and voiding mechanics for improved bladder habits.  Baseline:  Goal status: INITIAL  PLAN: PT FREQUENCY: 1x/week  PT DURATION:  8 sessions  PLANNED INTERVENTIONS: Therapeutic exercises, Therapeutic activity, Neuromuscular re-education, Patient/Family education, Self Care, Joint mobilization, Dry Needling, Spinal mobilization, Cryotherapy, Moist heat, scar mobilization, Taping, Biofeedback, and Manual therapy  PLAN FOR NEXT SESSION:core/hip strengthening with coordination of pelvic floor and breathing mechanics; internal if needed for improved coordination of pelvic floor    Stacy Gardner, PT, DPT 12/06/234:08 PM

## 2022-03-08 ENCOUNTER — Ambulatory Visit: Payer: No Typology Code available for payment source | Admitting: Physical Therapy

## 2022-03-08 DIAGNOSIS — M6281 Muscle weakness (generalized): Secondary | ICD-10-CM

## 2022-03-08 DIAGNOSIS — R279 Unspecified lack of coordination: Secondary | ICD-10-CM

## 2022-03-08 DIAGNOSIS — R293 Abnormal posture: Secondary | ICD-10-CM

## 2022-03-08 NOTE — Therapy (Signed)
OUTPATIENT PHYSICAL THERAPY FEMALE PELVIC EVALUATION   Patient Name: Sarah Ramos MRN: [id number] DOB:June 06, 1965, 56 y.o., female Today's Date: 03/08/2022   PT End of Session - 03/08/22 1535     Visit Number 5    Date for PT Re-Evaluation 03/22/22    Authorization Type [employer]    PT Start Time 1532    PT Stop Time 1610    PT Time Calculation (min) 38 min    Activity Tolerance Patient tolerated treatment well    Behavior During Therapy [facility] for tasks assessed/performed             Past Medical History:  Diagnosis Date   Arthritis    Chronic fatigue    CIN I (cervical intraepithelial neoplasia I) 03/28/2003   Colitis    Fibromyalgia    GERD (gastroesophageal reflux disease)    Headache    Skin rash    Ulcerative colitis ([place])    Past Surgical History:  Procedure Laterality Date   CERVICAL DISC SURGERY     CHOLECYSTECTOMY     GYNECOLOGIC CRYOSURGERY  1991   CIN 1   INTRAUTERINE DEVICE INSERTION  01-2006, 02/2011, 05/05/2016   Mirena   KNEE ARTHROSCOPY WITH MEDIAL PATELLAR FEMORAL LIGAMENT RECONSTRUCTION Left 07/25/2021   Procedure: KNEE ARTHROSCOPY WITH REMOVAL OF FOREIGN BODY  MEDIAL PATELLAR FEMORAL LIGAMENT RECONSTRUCTION WITH ALLOGRAFT;  Surgeon: Nicholes Stairs, MD;  Location: [facility];  Service: Orthopedics;  Laterality: Left;  120   Patient Active Problem List   Diagnosis Date Noted   S/P arthroscopic surgery of left knee 07/26/2021   Patellar instability of left knee 07/25/2021   Fatigue 01/20/2016   Insomnia 01/20/2016   Trapezius muscle spasm 01/20/2016   Fibromyalgia 07/03/2012   Colitis     PCP: Scifres, Durel Salts  REFERRING PROVIDER: Tamela Gammon, NP  REFERRING DIAG: R35.1 (ICD-10-CM) - Nocturia R39.15 (ICD-10-CM) - Urinary urgency N81.89 (ICD-10-CM) - Pelvic floor weakness in female  THERAPY DIAG:  Unspecified lack of coordination  Muscle weakness (generalized)  Abnormal posture  Rationale for Evaluation and Treatment  Rehabilitation  ONSET DATE: lifelong increased frequency, but has been worsening with age  SUBJECTIVE:                                                                                                                                                                                           SUBJECTIVE STATEMENT: Pt denies any leakage for several weeks, urgency has been better too and has been cutting back on nighttime urine voids. Now only getting 2-3x at night, during the day varies but feels around every 2 hours but thinks if  she drinks something will have to pee in about 30 mins later.   Fluid intake: Yes: 60 oz; sometimes other drinks but mostly water     PAIN:  Are you having pain? No   PRECAUTIONS: None  WEIGHT BEARING RESTRICTIONS No  FALLS:  Has patient fallen in last 6 months? Yes. 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  PATIENT GOALS to have less urgency and frequency   PERTINENT HISTORY:  Patellar instability of the left knee Admission Diagnoses: Status post left knee arthroscopy with MPFL reconstruction; CIN I, fibromyalgia, Ulcerative colitis Sexual abuse: No  BOWEL MOVEMENT Pain with bowel movement: No Type of bowel movement:Type (Bristol Stool Scale) 4, Frequency daily, and Strain No Fully empty rectum: Yes:   Leakage: No Pads: No Fiber supplement: No  URINATION Pain with urination: No Fully empty bladder: Yes: but sits for a little longer to make sure fully empty and doesn't rush Stream: Strong Urgency: Yes:   Frequency: every 2 hours during day, 5-6x per night Leakage:  none Pads: No  INTERCOURSE Pain with intercourse: During Penetration Ability to have vaginal penetration:  Yes:   Climax: not painful Marinoff  Scale: 1/3  PREGNANCY Vaginal deliveries 1 Tearing Yes: forceps  C-section deliveries 0 Currently pregnant No  PROLAPSE None    OBJECTIVE:   DIAGNOSTIC FINDINGS:    COGNITION:  Overall cognitive status: Within functional limits for tasks assessed     SENSATION:  Light touch: Appears intact  Proprioception: Appears intact  MUSCLE LENGTH: Bil hamstrings and adductors limited by 25%                POSTURE: rounded shoulders, forward head, and posterior pelvic tilt  LUMBARAROM/PROM  A/PROM A/PROM  eval  Flexion Decreased by 25%  Extension WFL  Right lateral flexion WFL  Left lateral flexion WFL  Right rotation Decreased by 25%  Left rotation Decreased by 25%   (Blank rows = not tested)  LOWER EXTREMITY ROM:  WFL  LOWER EXTREMITY MMT:  Bil hips grossly 4/5, RT knee 5/5, Lt knee 4/5, ankles 5/5   PALPATION:   General  no TTP                 External Perineal Exam mild dryness but otherwise [facility]                             Internal Pelvic Floor mild TTP at Rt obturator internus  Patient confirms identification and approves PT to assess internal pelvic floor and treatment Yes  PELVIC MMT:   MMT eval  Vaginal 2/5 however great difficulty with technique initially with demonstrating pushing downward with attempts to contract pelvic floor, needed max VC for 2 reps of no downward pressure and slight squeeze  Internal Anal Sphincter   External Anal Sphincter   Puborectalis   Diastasis Recti   (Blank rows = not tested)        TONE: Mildly decreased  PROLAPSE: Not seen in hooklying   TODAY'S TREATMENT  03/08/22:  NMRE: all exercises cued for breathing and pelvic floor coordination and improved core and pelvic floor activation  2x10 bridges with 8# (held with both hands press) 2x10 dead bugs 4# dumb bell 2X10 Sit  to stand from mat table 15# Mare Ferrari carry 15#  +20# 300' x2 Dead lifts 10# 2x10   PATIENT EDUCATION:  Education details: bladder  irritants, moisturizers, and urge drill; DNWHACYV Person educated: Patient Education method: Explanation, Demonstration, Tactile cues, Verbal cues, and Handouts Education comprehension: verbalized understanding and returned demonstration   HOME EXERCISE PROGRAM: bladder irritants, moisturizers, and urge drill; DNWHACYV  ASSESSMENT:  CLINICAL IMPRESSION: Patient reports she has noted improvement with urgency and frequency and pleased with this. Pt session focused on coordination of pelvic floor and breathing mechanics with hip and core strengthening. Pt tolerated well and benefited from minimal cues for technique and had improved challenge today with increased resistance. Pt would benefit from additional PT to further address deficits.       OBJECTIVE IMPAIRMENTS decreased coordination, decreased endurance, decreased strength, increased fascial restrictions, increased muscle spasms, impaired flexibility, improper body mechanics, and postural dysfunction.   ACTIVITY LIMITATIONS continence and locomotion level  PARTICIPATION LIMITATIONS: interpersonal relationship, community activity, and occupation  PERSONAL FACTORS Past/current experiences and Time since onset of injury/illness/exacerbation are also affecting patient's functional outcome.   REHAB POTENTIAL: Good  CLINICAL DECISION MAKING: Stable/uncomplicated  EVALUATION COMPLEXITY: Low   GOALS: Goals reviewed with patient? Yes  SHORT TERM GOALS: Target date: 01/18/2022  Pt to be I with HEP.  Baseline: Goal status: MET  2.  Pt will have 25% less urgency due to bladder retraining and strengthening  Baseline:  Goal status: MET  3.  Pt to report improved time between bladder voids to at least 2.5 hours during the day and only getting up 3-4x per night for improved QOL with decreased urinary frequency.   Baseline: 5-6x/night, 2 hours daily Goal status: MET  4.  Pt to demonstrate at least 3/5 pelvic floor strength without  compensatory strategies for improved pelvic stability and decreased strain at pelvic floor/ decrease leakage.  Baseline:  Goal status: on going   LONG TERM GOALS: Target date: 03/22/22   Pt to be I with advanced HEP.  Baseline:  Goal status: MET  2.  Pt will have 50% less urgency due to bladder retraining and strengthening  Baseline:  Goal status: MET  3.  Pt to report improved time between bladder voids to at least 3 hours during the day and only getting up at night 2-3x for improved QOL with decreased urinary frequency.   Baseline:  Goal status: on going  4.  Pt to demonstrate at least 4/5 pelvic floor strength for improved pelvic stability and decreased strain at pelvic floor/ decrease leakage.  Baseline:  Goal status: on going   5.  Pt to be I with diaphragmatic breathing and voiding mechanics for improved bladder habits.  Baseline:  Goal status: MET  PLAN: PT FREQUENCY: 1x/week  PT DURATION:  8 sessions  PLANNED INTERVENTIONS: Therapeutic exercises, Therapeutic activity, Neuromuscular re-education, Patient/Family education, Self Care, Joint mobilization, Dry Needling, Spinal mobilization, Cryotherapy, Moist heat, scar mobilization, Taping, Biofeedback, and Manual therapy  PLAN FOR NEXT SESSION:core/hip strengthening with coordination of pelvic floor and breathing mechanics; internal if needed for improved coordination of pelvic floor    Stacy Gardner, PT, DPT 12/13/234:25 PM

## 2022-03-15 ENCOUNTER — Ambulatory Visit: Payer: No Typology Code available for payment source | Admitting: Physical Therapy

## 2022-03-15 DIAGNOSIS — M6281 Muscle weakness (generalized): Secondary | ICD-10-CM

## 2022-03-15 DIAGNOSIS — R279 Unspecified lack of coordination: Secondary | ICD-10-CM | POA: Diagnosis not present

## 2022-03-15 DIAGNOSIS — R293 Abnormal posture: Secondary | ICD-10-CM

## 2022-03-15 NOTE — Therapy (Signed)
OUTPATIENT PHYSICAL THERAPY FEMALE PELVIC EVALUATION   Patient Name: Sarah Ramos MRN: [id number] DOB:17-Mar-1966, 56 y.o., female Today's Date: 03/15/2022   PT End of Session - 03/15/22 1537     Visit Number 6    Date for PT Re-Evaluation 03/22/22    Authorization Type [employer]    PT Start Time 1533    PT Stop Time 1612    PT Time Calculation (min) 39 min    Activity Tolerance Patient tolerated treatment well    Behavior During Therapy [facility] for tasks assessed/performed             Past Medical History:  Diagnosis Date   Arthritis    Chronic fatigue    CIN I (cervical intraepithelial neoplasia I) 03/28/2003   Colitis    Fibromyalgia    GERD (gastroesophageal reflux disease)    Headache    Skin rash    Ulcerative colitis ([place])    Past Surgical History:  Procedure Laterality Date   CERVICAL DISC SURGERY     CHOLECYSTECTOMY     GYNECOLOGIC CRYOSURGERY  1991   CIN 1   INTRAUTERINE DEVICE INSERTION  01-2006, 02/2011, 05/05/2016   Mirena   KNEE ARTHROSCOPY WITH MEDIAL PATELLAR FEMORAL LIGAMENT RECONSTRUCTION Left 07/25/2021   Procedure: KNEE ARTHROSCOPY WITH REMOVAL OF FOREIGN BODY  MEDIAL PATELLAR FEMORAL LIGAMENT RECONSTRUCTION WITH ALLOGRAFT;  Surgeon: Nicholes Stairs, MD;  Location: [facility];  Service: Orthopedics;  Laterality: Left;  120   Patient Active Problem List   Diagnosis Date Noted   S/P arthroscopic surgery of left knee 07/26/2021   Patellar instability of left knee 07/25/2021   Fatigue 01/20/2016   Insomnia 01/20/2016   Trapezius muscle spasm 01/20/2016   Fibromyalgia 07/03/2012   Colitis     PCP: Scifres, Durel Salts  REFERRING PROVIDER: Tamela Gammon, NP  REFERRING DIAG: R35.1 (ICD-10-CM) - Nocturia R39.15 (ICD-10-CM) - Urinary urgency N81.89 (ICD-10-CM) - Pelvic floor weakness in female  THERAPY DIAG:  Unspecified lack of coordination  Muscle weakness (generalized)  Abnormal posture  Rationale for Evaluation and Treatment  Rehabilitation  ONSET DATE: lifelong increased frequency, but has been worsening with age  SUBJECTIVE:                                                                                                                                                                                           SUBJECTIVE STATEMENT: Pt reports she is very pleased with progress and would like today to be DC and comfortable this.   Fluid intake: Yes: 60 oz; sometimes other drinks but mostly water     PAIN:  Are you having  pain? No   PRECAUTIONS: None  WEIGHT BEARING RESTRICTIONS No  FALLS:  Has patient fallen in last 6 months? Yes. 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  PATIENT GOALS to have less urgency and frequency   PERTINENT HISTORY:  Patellar instability of the left knee Admission Diagnoses: Status post left knee arthroscopy with MPFL reconstruction; CIN I, fibromyalgia, Ulcerative colitis Sexual abuse: No  BOWEL MOVEMENT Pain with bowel movement: No Type of bowel movement:Type (Bristol Stool Scale) 4, Frequency daily, and Strain No Fully empty rectum: Yes:   Leakage: No Pads: No Fiber supplement: No  URINATION Pain with urination: No Fully empty bladder: Yes: but sits for a little longer to make sure fully empty and doesn't rush Stream: Strong Urgency: Yes:   Frequency: every 2 hours during day, 5-6x per night Leakage:  none Pads: No  INTERCOURSE Pain with intercourse: During Penetration Ability to have vaginal penetration:  Yes:   Climax: not painful Marinoff Scale: 1/3  PREGNANCY Vaginal deliveries 1 Tearing Yes: forceps  C-section deliveries 0 Currently pregnant No  PROLAPSE None    OBJECTIVE:   DIAGNOSTIC FINDINGS:     COGNITION:  Overall cognitive status: Within functional limits for tasks assessed     SENSATION:  Light touch: Appears intact  Proprioception: Appears intact  MUSCLE LENGTH: Bil hamstrings and adductors limited by 25%                POSTURE: rounded shoulders, forward head, and posterior pelvic tilt  LUMBARAROM/PROM  A/PROM A/PROM  eval  Flexion Decreased by 25%  Extension WFL  Right lateral flexion WFL  Left lateral flexion WFL  Right rotation Decreased by 25%  Left rotation Decreased by 25%   (Blank rows = not tested)  LOWER EXTREMITY ROM:  WFL  LOWER EXTREMITY MMT:  Bil hips grossly 4/5, RT knee 5/5, Lt knee 4/5, ankles 5/5   PALPATION:   General  no TTP                 External Perineal Exam mild dryness but otherwise [facility]                             Internal Pelvic Floor mild TTP at Rt obturator internus  Patient confirms identification and approves PT to assess internal pelvic floor and treatment Yes  PELVIC MMT:   MMT eval  Vaginal 2/5 however great difficulty with technique initially with demonstrating pushing downward with attempts to contract pelvic floor, needed max VC for 2 reps of no downward pressure and slight squeeze  Internal Anal Sphincter   External Anal Sphincter   Puborectalis   Diastasis Recti   (Blank rows = not tested)        TONE: Mildly decreased  PROLAPSE: Not seen in hooklying   TODAY'S TREATMENT  03/15/22  NMRE: all exercises cued for breathing and pelvic floor coordination and improved core and pelvic floor activation  2x10 bridges with 8# (held with both hands press) 2x10 bridges with ball squeezes 2X10 Sit to stand from mat table 15# Mario punches 7# 2x10 Mare Ferrari carry 15#  +20# 1000' Dead lifts 10# 2x10   PATIENT EDUCATION:  Education details: bladder irritants, moisturizers, and urge drill; [place] Person  educated: Patient Education method: Explanation, Demonstration, Tactile cues, Verbal cues, and  Handouts Education comprehension: verbalized understanding and returned demonstration   HOME EXERCISE PROGRAM: bladder irritants, moisturizers, and urge drill; [id number]  ASSESSMENT:  CLINICAL IMPRESSION: Patient reports she has no longer had concerns and urgency and frequency much more tolerable and feels comfortable with DC post treatment today. Pt session focused on hip and core strengthening with emphasis on pelvic floor coordination with all tasks. Pt has met all goals except internal reassessment of strength as she is having improvement with symptoms. This will serve as pt's DC from PT post DC today and pt pleased with progress and denied concerns for PT.    OBJECTIVE IMPAIRMENTS decreased coordination, decreased endurance, decreased strength, increased fascial restrictions, increased muscle spasms, impaired flexibility, improper body mechanics, and postural dysfunction.   ACTIVITY LIMITATIONS continence and locomotion level  PARTICIPATION LIMITATIONS: interpersonal relationship, community activity, and occupation  PERSONAL FACTORS Past/current experiences and Time since onset of injury/illness/exacerbation are also affecting patient's functional outcome.   REHAB POTENTIAL: Good  CLINICAL DECISION MAKING: Stable/uncomplicated  EVALUATION COMPLEXITY: Low   GOALS: Goals reviewed with patient? Yes  SHORT TERM GOALS: Target date: 01/18/2022  Pt to be I with HEP.  Baseline: Goal status: MET  2.  Pt will have 25% less urgency due to bladder retraining and strengthening  Baseline:  Goal status: MET  3.  Pt to report improved time between bladder voids to at least 2.5 hours during the day and only getting up 3-4x per night for improved QOL with decreased urinary frequency.   Baseline: 5-6x/night, 2 hours daily Goal status: MET  4.  Pt to demonstrate at least 3/5 pelvic floor strength without compensatory strategies for improved pelvic stability and decreased strain at pelvic  floor/ decrease leakage.  Baseline:  Goal status: pt declined therefore not test   LONG TERM GOALS: Target date: 03/22/22   Pt to be I with advanced HEP.  Baseline:  Goal status: MET  2.  Pt will have 50% less urgency due to bladder retraining and strengthening  Baseline:  Goal status: MET  3.  Pt to report improved time between bladder voids to at least 3 hours during the day and only getting up at night 2-3x for improved QOL with decreased urinary frequency.   Baseline:  Goal status: MET  4.  Pt to demonstrate at least 4/5 pelvic floor strength for improved pelvic stability and decreased strain at pelvic floor/ decrease leakage.  Baseline:  Goal status: pt declined therefore not rested   5.  Pt to be I with diaphragmatic breathing and voiding mechanics for improved bladder habits.  Baseline:  Goal status: MET  PLAN: PT FREQUENCY: 1x/week  PT DURATION:  8 sessions  PLANNED INTERVENTIONS: Therapeutic exercises, Therapeutic activity, Neuromuscular re-education, Patient/Family education, Self Care, Joint mobilization, Dry Needling, Spinal mobilization, Cryotherapy, Moist heat, scar mobilization, Taping, Biofeedback, and Manual therapy  PLAN FOR NEXT SESSION:   PHYSICAL THERAPY DISCHARGE SUMMARY  Visits from Start of Care: 6  Current functional level related to goals / functional outcomes: All goals met except internal reassessment of strength as pt declined due to improvement of symptoms   Remaining deficits: Pt denies symptoms of concern reports her urgency and nighttime much better and now able to tolerate    Education / Equipment: HEP   Patient agrees to discharge. Patient goals were partially met. Patient is being discharged due to being pleased with the current functional level.   Stacy Gardner, PT,  DPT 12/20/234:25 PM

## 2022-03-21 ENCOUNTER — Encounter: Payer: No Typology Code available for payment source | Admitting: Physical Therapy

## 2022-12-05 ENCOUNTER — Ambulatory Visit: Payer: No Typology Code available for payment source | Admitting: Nurse Practitioner

## 2023-03-08 ENCOUNTER — Ambulatory Visit (INDEPENDENT_AMBULATORY_CARE_PROVIDER_SITE_OTHER): Payer: No Typology Code available for payment source | Admitting: Nurse Practitioner

## 2023-03-08 VITALS — BP 124/76 | HR 77 | Ht 66.14 in | Wt 271.0 lb

## 2023-03-08 DIAGNOSIS — N951 Menopausal and female climacteric states: Secondary | ICD-10-CM

## 2023-03-08 DIAGNOSIS — Z30431 Encounter for routine checking of intrauterine contraceptive device: Secondary | ICD-10-CM | POA: Diagnosis not present

## 2023-03-08 DIAGNOSIS — Z01419 Encounter for gynecological examination (general) (routine) without abnormal findings: Secondary | ICD-10-CM

## 2023-03-08 NOTE — Progress Notes (Signed)
Sarah Ramos 17-Jul-1965 [id number]   History:  57 y.o. G1P1001 presents for annual exam. Mirena IUD 04/2016, amenorrheic. Having hot flashes. 1991 cryosurgery CIN-1, 2005 CIN-1 without intervention, subsequent paps normal.   Gynecologic History No LMP recorded. (Menstrual status: IUD).   Contraception/Family planning: IUD Sexually active: Yes  Health Maintenance Last Pap: 11/25/2020. Results were: Normal, 3-year repeat Last mammogram: 09/16/2021. Results were: Normal Last colonoscopy: 01/04/2022, 2-year recall d/t UC Last Dexa: Not indicated  Past medical history, past surgical history, family history and social history were all reviewed and documented in the EPIC chart. Long-term boyfriend. Works remote for [employer]. 56 yo son. Mother diagnosed with breast cancer at 31, had 3 times.   ROS:  A ROS was performed and pertinent positives and negatives are included.  Exam:  Vitals:   03/08/23 0802  BP: 124/76  Pulse: 77  SpO2: 100%  Weight: 271 lb (122.9 kg)  Height: 5' 6.14" (1.68 m)     Body mass index is 43.55 kg/m.  General appearance:  Normal Thyroid:  Symmetrical, normal in size, without palpable masses or nodularity. Respiratory  Auscultation:  Clear without wheezing or rhonchi Cardiovascular  Auscultation:  Regular rate, without rubs, murmurs or gallops  Edema/varicosities:  Not grossly evident Abdominal  Soft,nontender, without masses, guarding or rebound.  Liver/spleen:  No organomegaly noted  Hernia:  None appreciated  Skin  Inspection:  Grossly normal Breasts: Examined lying and sitting.   Right: Without masses, retractions, nipple discharge or axillary adenopathy.   Left: Without masses, retractions, nipple discharge or axillary adenopathy. Pelvic: External genitalia:  no lesions              Urethra:  normal appearing urethra with no masses, tenderness or lesions              Bartholins and Skenes: normal                 Vagina: normal  appearing vagina with normal color and discharge, no lesions              Cervix: no lesions. IUD strings visible Bimanual Exam:  Uterus:  no masses or tenderness              Adnexa: no mass, fullness, tenderness              Rectovaginal: Deferred              Anus:  normal, no lesions  Patient informed chaperone available to be present for breast and pelvic exam. Patient has requested no chaperone to be present. Patient has been advised what will be completed during breast and pelvic exam.   Assessment/Plan:  57 y.o. G1P1001 for annual exam.   Well female exam with routine gynecological exam - Education provided on SBEs, importance of preventative screenings, current guidelines, high calcium diet, regular exercise, and multivitamin daily.  Labs with PCP.   Encounter for routine checking of intrauterine contraceptive device (IUD) - Amenorrheic.  Mirena IUD 04/2016.  Aware of 8-year FDA approval.   Perimenopausal vasomotor symptoms - experiencing hot flashes. OTC supplement list provided.   Screening for cervical cancer - 1991 cryosurgery CIN-1, 2005 CIN-1, subsequent paps normal. Will repeat at 3-year interval per guidelines.   Screening for breast cancer - Normal mammogram history. . Overdue and encouraged to schedule. Normal breast exam today.  Screening for colon cancer - 2023 colonoscopy, has screenings every 2 years d/t UC.   Return in about 1 year (  around 03/07/2024) for Annual.    Olivia Mackie DNP, 8:20 AM 03/08/2023

## 2023-10-15 ENCOUNTER — Other Ambulatory Visit: Payer: Self-pay | Admitting: Nurse Practitioner

## 2023-10-15 DIAGNOSIS — Z0000 Encounter for general adult medical examination without abnormal findings: Secondary | ICD-10-CM

## 2023-10-16 ENCOUNTER — Ambulatory Visit
Admission: RE | Admit: 2023-10-16 | Discharge: 2023-10-16 | Disposition: A | Discharge status: Home / Self Care | Source: Ambulatory Visit | Attending: Nurse Practitioner | Admitting: Nurse Practitioner

## 2023-10-16 DIAGNOSIS — Z0000 Encounter for general adult medical examination without abnormal findings: Secondary | ICD-10-CM

## 2024-02-08 ENCOUNTER — Encounter (INDEPENDENT_AMBULATORY_CARE_PROVIDER_SITE_OTHER): Payer: Self-pay

## 2024-02-08 ENCOUNTER — Ambulatory Visit (INDEPENDENT_AMBULATORY_CARE_PROVIDER_SITE_OTHER)

## 2024-02-08 VITALS — BP 132/75 | HR 98 | Ht 66.0 in | Wt 266.0 lb

## 2024-02-08 DIAGNOSIS — J342 Deviated nasal septum: Secondary | ICD-10-CM

## 2024-02-08 DIAGNOSIS — J343 Hypertrophy of nasal turbinates: Secondary | ICD-10-CM

## 2024-02-08 DIAGNOSIS — J309 Allergic rhinitis, unspecified: Secondary | ICD-10-CM | POA: Diagnosis not present

## 2024-02-08 DIAGNOSIS — R0981 Nasal congestion: Secondary | ICD-10-CM | POA: Diagnosis not present

## 2024-02-08 DIAGNOSIS — G4710 Hypersomnia, unspecified: Secondary | ICD-10-CM | POA: Diagnosis not present

## 2024-02-08 MED ORDER — FLUTICASONE PROPIONATE 50 MCG/ACT NA SUSP: 2.0000 | Freq: Every day | NASAL | 6 refills | Status: AC

## 2024-02-08 MED ORDER — AZELASTINE HCL 0.1 % NA SOLN: 1.0000 | Freq: Two times a day (BID) | NASAL | 12 refills | Status: AC

## 2024-02-08 NOTE — Progress Notes (Signed)
 Dear Dr. Cristopher, Here is my assessment for our mutual patient, Sarah Ramos. Thank you for allowing me the opportunity to care for your patient. Please do not hesitate to contact me should you have any other questions. Sincerely, Dr. Penne Croak  Otolaryngology Clinic Note Referring provider: Dr. Cristopher HPI:  Discussed the use of AI scribe software for clinical note transcription with the patient, who gave verbal consent to proceed.  History of Present Illness Sarah Ramos is a 58 year old female with chronic nasal congestion and allergies who presents for evaluation of her symptoms.  Chronic nasal congestion and allergic symptoms - Chronic nasal congestion with worsening during seasonal changes - Associated symptoms include headaches and fatigue - Poor sleep quality attributed to nasal symptoms - Inconsistent use of Flonase due to concerns about polypharmacy  Headache history - History of headaches, previously exacerbated by ibuprofen use - Improvement in headache frequency after discontinuing ibuprofen due to ulcerative colitis - Initial rebound headaches attributed to uncorrected vision issues - No current use of ibuprofen  Sleep disturbance and chronic fatigue - Chronic fatigue and poor sleep quality - Describes herself as an insomniac - Unlikely to fall asleep during the day despite persistent tiredness - Home sleep recordings have not indicated sleep apnea  Medication intolerance - Significant adverse reaction to Topamax in her twenties, resulting in severe vomiting - No longer takes Topamax due to intolerance  Comorbid conditions impacting symptoms - Ulcerative colitis managed with Remicade  infusions every eight weeks - Fibromyalgia contributing to chronic fatigue and overall tiredness 0 Do you snore loudly? yes Do you often feel tired, fatigued, or sleepy during the daytime? Yes  Has anyone observed you stop breathing during sleep? no Do you have (or are  you being treated for) high blood pressure? No  Objective measures:  BMI  >35 kg/m+1  Age >50 years+1  Neck circumference >40 cm+1  Gender  Female 0  Total: 5 points  The Epworth Sleepiness Scale questionnaire consists of eight questions that ask about the likelihood of dozing off or falling asleep in the following situations: 1. Sitting and reading 0  2. Watching TV 0  3. Sitting inactive in a public place (e.g., a theater or meeting) 0  4. As a passenger in a car for an hour without a break 0  5. Lying down to rest in the afternoon when circumstances permit 0  6. Sitting and talking to someone 0   7. Sitting quietly after a lunch without alcohol 0  8. In a car, while stopped for a few minutes in traffic 0  Each situation is rated on a scale from 0 (would never doze) to 3 (high chance of dozing), and the total score ranges from 0 to 24, with higher scores indicating greater daytime sleepiness.[1   Independent Review of Additional Tests or Records:  Reviewed external note from referring PCP, Hester,describing relevant history incorporated into today's evaluation.   PMH/Meds/All/SocHx/FamHx/ROS:   Past Medical History:  Diagnosis Date   Arthritis    Chronic fatigue    CIN I (cervical intraepithelial neoplasia I) 03/28/2003   Colitis    Fibromyalgia    GERD (gastroesophageal reflux disease)    Headache    Skin rash    Ulcerative colitis ([facility])      Past Surgical History:  Procedure Laterality Date   CERVICAL DISC SURGERY     CHOLECYSTECTOMY     GYNECOLOGIC CRYOSURGERY  1991   CIN 1   INTRAUTERINE DEVICE INSERTION  01-2006, 02/2011, 05/05/2016   Mirena    KNEE ARTHROSCOPY WITH MEDIAL PATELLAR FEMORAL LIGAMENT RECONSTRUCTION Left 07/25/2021   Procedure: KNEE ARTHROSCOPY WITH REMOVAL OF FOREIGN BODY  MEDIAL PATELLAR FEMORAL LIGAMENT RECONSTRUCTION WITH ALLOGRAFT;  Surgeon: Sharl Selinda Dover, MD;  Location: [facility];  Service: Orthopedics;   Laterality: Left;  120    Family History  Problem Relation Age of Onset   Breast cancer Mother 64       recur twice (3x total)   Bone cancer Mother    Heart disease Paternal Grandmother    Heart disease Paternal Grandfather      Social Connections: Not on file      Current Outpatient Medications:    aspirin  EC 81 MG tablet, Take 1 tablet (81 mg total) by mouth daily., Disp: 45 tablet, Rfl: 0   azelastine (ASTELIN) 0.1 % nasal spray, Place 1 spray into both nostrils 2 (two) times daily. Use in each nostril as directed, Disp: 30 mL, Rfl: 12   cholecalciferol (VITAMIN D3) 25 MCG (1000 UNIT) tablet, Take 1 tablet (1,000 Units total) by mouth daily., Disp: , Rfl:    COLLAGEN PO, Take 2 Scoops by mouth in the morning., Disp: , Rfl:    Cyanocobalamin (VITAMIN B 12 PO), Take 1 tablet by mouth daily., Disp: , Rfl:    cyclobenzaprine  (FLEXERIL ) 5 MG tablet, Take 1 tablet (5 mg total) by mouth 3 (three) times daily as needed for muscle spasms., Disp: 40 tablet, Rfl: 1   diclofenac  sodium (VOLTAREN ) 1 % GEL, Voltaren  Gel 3 grams to 3 large joints upto TID 5 TUBES with 3 refills, Disp: 5 Tube, Rfl: 3   fluticasone (FLONASE) 50 MCG/ACT nasal spray, Place 2 sprays into both nostrils daily., Disp: 16 g, Rfl: 6   furosemide (LASIX) 20 MG tablet, TK 1 T PO ONCE A DAY, Disp: , Rfl: 3   Glucosamine HCl (GLUCOSAMINE PO), Take 2 tablets by mouth daily. fibromyalgia, Disp: , Rfl:    inFLIXimab  (REMICADE ) 100 MG injection, Inject into the vein See admin instructions. Every eight weeks, Disp: , Rfl:    Multiple Vitamin (MULTIVITAMIN) capsule, Take 1 capsule by mouth daily.  , Disp: , Rfl:    traZODone (DESYREL) 50 MG tablet, Take 25 mg by mouth at bedtime as needed. (Patient not taking: Reported on 02/08/2024), Disp: , Rfl:   Current Facility-Administered Medications:    levonorgestrel  (MIRENA ) 20 MCG/24HR IUD, , Intrauterine, Once, Fontaine, Evalene SQUIBB, MD   Physical Exam:   BP 132/75 (BP Location: Right  Arm, Patient Position: Sitting, Cuff Size: Large)   Pulse 98   Ht 5' 6 (1.676 m)   Wt 266 lb (120.7 kg)   SpO2 95%   BMI 42.93 kg/m   The patient was awake, alert, and appropriate. The external ears were inspected, and otoscopy was performed to evaluate the external auditory canals and tympanic membranes. The nasal cavity and septum were examined for mucosal changes, obstruction, or discharge. The oral cavity and oropharynx were inspected for mucosal lesions, infection, or tonsillar hypertrophy. The neck was palpated for lymphadenopathy, thyroid abnormalities, or other masses. Cranial nerve function was grossly intact.  Pertinent Findings: Physical Exam HEENT: Normocephalic. Throat normal. Mid septal deflection to the right with clear drainage. Anterior septal deflection to the left. No nasal polyps, masses, or growths.  Seprately Identifiable Procedures:  I personally ordered, reviewed and interpreted the following with the patient today  Given the patient's symptoms and incomplete visualization of critical sinonasal areas with anterior  rhinoscopy, a separately performed diagnostic nasal endoscopy procedure is indicated for a complete rhinologic evaluation per American Rhinologic Society recommendations ([email or web address]://[email or web address]-rhinologic.org/position-statements)  I personally ordered, reviewed and interpreted the following with the patient today  Procedure Note Diagnostic Nasal Endoscopy CPT CODE -- 68768 - Mod 25  Prior to initiating any procedures, risks/benefits/alternatives were explained to the patient and verbal consent obtained.  Pre-procedure diagnosis: Concern for obstruction and sinusitis Post-procedure diagnosis: same Indication: See pre-procedure diagnosis and physical exam above Complications: None apparent EBL: 0 mL Anesthesia: Lidocaine  4% and topical decongestant was topically sprayed in each nasal cavity  Description of Procedure:  Patient was identified. A flexible  fiberoptic endoscope was utilized to evaluate the sinonasal cavities, mucosa, sinus ostia and turbinates and septum.  Overall, signs of mucosal inflammation are noted.  Also noted are deviated nasal septum and inf turbinate hypertrophy.  No mucopurulence, polyps, or masses noted.   Right Middle meatus: congested Right SE Recess: clear Left MM: congested Left SE Recess: clear  Impression & Plans:  Bethanne Mule is a 58 y.o. female  1. Hypersomnolence   2. Allergic rhinitis, unspecified seasonality, unspecified trigger   3. DNS (deviated nasal septum)   4. Hypertrophy of inferior nasal turbinate   5. Nasal congestion     - Findings and diagnoses discussed in detail with the patient. - Risks, benefits, and alternatives were reviewed. Through shared decision making, the patient elects to proceed with below. Assessment & Plan Allergic rhinitis with chronic nasal congestion and deviated nasal septum Chronic nasal congestion due to deviated septum. Flonase previously ineffective due to improper use. Flonase safe with Topamax. Nasal sprays preferred over oral medications. Will try two month trial of nasal sprays.  - Prescribed Flonase nasal spray, two sprays twice daily. - Prescribed azelastine nasal spray. - Scheduled follow-up in two months.  Possible OSA, educated on risks Declined PSG today. Patient wants to continue her GLP1 treatment longer before seeking a sleep study. She plans to see a sleep specialist in the near future because she also complains of trouble falling asleep.    - Orders placed: No orders of the defined types were placed in this encounter.  - Medications prescribed/continued/adjusted:  Meds ordered this encounter  Medications   fluticasone (FLONASE) 50 MCG/ACT nasal spray    Sig: Place 2 sprays into both nostrils daily.    Dispense:  16 g    Refill:  6   azelastine (ASTELIN) 0.1 % nasal spray    Sig: Place 1 spray into both nostrils 2 (two) times daily. Use in  each nostril as directed    Dispense:  30 mL    Refill:  12   - Education materials provided to the patient. - Follow up: 2 monhts. Patient instructed to return sooner or go to the ED if new/worsening symptoms develop.   Thank you for allowing me the opportunity to care for your patient. Please do not hesitate to contact me should you have any other questions.  Sincerely, Penne Croak, DO Otolaryngologist (ENT) [facility] Phone: [phone] Fax: [phone]  02/09/2024, 1:03 PM

## 2024-03-10 NOTE — Progress Notes (Unsigned)
° °  Sarah Ramos Jun 12, 1965 [id number]   History:  58 y.o. G1P1001 presents for annual exam. Mirena  IUD 04/2016, amenorrheic. Wants IUD removed today. Having hot flashes. 1991 cryosurgery CIN-1, 2005 CIN-1 without intervention, subsequent paps normal.   Gynecologic History No LMP recorded. (Menstrual status: IUD).   Contraception/Family planning: IUD Sexually active: Yes  Health Maintenance Last Pap: 11/25/2020. Results were: Normal Last mammogram: 10/16/2023. Results were: Normal Last colonoscopy: 01/04/2022, 2-year recall d/t UC Last Dexa: Not indicated     03/08/2023    8:04 AM  Depression screen PHQ 2/9  Decreased Interest 0  Down, Depressed, Hopeless 0  PHQ - 2 Score 0     Past medical history, past surgical history, family history and social history were all reviewed and documented in the EPIC chart. Long-term boyfriend. Works remote for [employer]. 35 yo son. Mother diagnosed with breast cancer at 27, had 3 times.   ROS:  A ROS was performed and pertinent positives and negatives are included.  Exam:  There were no vitals filed for this visit.    There is no height or weight on file to calculate BMI.  General appearance:  Normal Thyroid:  Symmetrical, normal in size, without palpable masses or nodularity. Respiratory  Auscultation:  Clear without wheezing or rhonchi Cardiovascular  Auscultation:  Regular rate, without rubs, murmurs or gallops  Edema/varicosities:  Not grossly evident Abdominal  Soft,nontender, without masses, guarding or rebound.  Liver/spleen:  No organomegaly noted  Hernia:  None appreciated  Skin  Inspection:  Grossly normal Breasts: Examined lying and sitting.   Right: Without masses, retractions, nipple discharge or axillary adenopathy.   Left: Without masses, retractions, nipple discharge or axillary adenopathy. Pelvic: External genitalia:  no lesions              Urethra:  normal appearing urethra with no masses, tenderness  or lesions              Bartholins and Skenes: normal                 Vagina: normal appearing vagina with normal color and discharge, no lesions              Cervix: no lesions. IUD strings visible Bimanual Exam:  Uterus:  no masses or tenderness              Adnexa: no mass, fullness, tenderness              Rectovaginal: Deferred              Anus:  normal, no lesions    Assessment/Plan:  58 y.o. G1P1001 for annual exam.   Well female exam with routine gynecological exam - Education provided on SBEs, importance of preventative screenings, current guidelines, high calcium diet, regular exercise, and multivitamin daily.  Labs with PCP.   Encounter for routine checking of intrauterine contraceptive device (IUD) - Amenorrheic.  Mirena  IUD 04/2016.  Aware of 8-year FDA approval.   Perimenopausal vasomotor symptoms - experiencing hot flashes. OTC supplement list provided.   Screening for cervical cancer - 1991 cryosurgery CIN-1, 2005 CIN-1, subsequent paps normal. Will repeat at 3-year interval per guidelines.   Screening for breast cancer - Normal mammogram history. Continue annual screenings. Normal breast exam today.  Screening for colon cancer - 2023 colonoscopy, has screenings every 2 years d/t UC.   No follow-ups on file.    Annabella DELENA Shutter DNP, 12:09 PM 03/10/2024

## 2024-03-11 ENCOUNTER — Ambulatory Visit: Payer: No Typology Code available for payment source | Admitting: Nurse Practitioner

## 2024-03-11 ENCOUNTER — Other Ambulatory Visit (HOSPITAL_COMMUNITY)
Admission: RE | Admit: 2024-03-11 | Discharge: 2024-03-11 | Disposition: A | Source: Ambulatory Visit | Attending: Nurse Practitioner | Admitting: Nurse Practitioner

## 2024-03-11 ENCOUNTER — Encounter: Payer: Self-pay | Admitting: Nurse Practitioner

## 2024-03-11 VITALS — BP 118/76 | HR 74 | Resp 16 | Ht 66.25 in | Wt 261.0 lb

## 2024-03-11 DIAGNOSIS — Z124 Encounter for screening for malignant neoplasm of cervix: Secondary | ICD-10-CM | POA: Insufficient documentation

## 2024-03-11 DIAGNOSIS — N898 Other specified noninflammatory disorders of vagina: Secondary | ICD-10-CM | POA: Diagnosis not present

## 2024-03-11 DIAGNOSIS — N3000 Acute cystitis without hematuria: Secondary | ICD-10-CM | POA: Diagnosis not present

## 2024-03-11 DIAGNOSIS — Z1331 Encounter for screening for depression: Secondary | ICD-10-CM

## 2024-03-11 DIAGNOSIS — R350 Frequency of micturition: Secondary | ICD-10-CM | POA: Diagnosis not present

## 2024-03-11 DIAGNOSIS — Z01419 Encounter for gynecological examination (general) (routine) without abnormal findings: Secondary | ICD-10-CM | POA: Diagnosis not present

## 2024-03-11 DIAGNOSIS — Z30432 Encounter for removal of intrauterine contraceptive device: Secondary | ICD-10-CM

## 2024-03-11 LAB — WET PREP FOR TRICH, YEAST, CLUE

## 2024-03-11 MED ORDER — SULFAMETHOXAZOLE-TRIMETHOPRIM 800-160 MG PO TABS: 1.0000 | ORAL_TABLET | Freq: Two times a day (BID) | ORAL | 0 refills | Status: AC

## 2024-03-13 LAB — CYTOLOGY - PAP
Adequacy: ABSENT
Comment: NEGATIVE
Diagnosis: NEGATIVE
High risk HPV: NEGATIVE

## 2024-03-14 ENCOUNTER — Ambulatory Visit: Payer: Self-pay | Admitting: Nurse Practitioner

## 2024-03-14 LAB — URINALYSIS, COMPLETE W/RFL CULTURE
Bilirubin Urine: NEGATIVE
Glucose, UA: NEGATIVE
Hyaline Cast: NONE SEEN /LPF
Ketones, ur: NEGATIVE
Nitrites, Initial: NEGATIVE
Specific Gravity, Urine: 1.015 (ref 1.001–1.035)
pH: 6.5 (ref 5.0–8.0)

## 2024-03-14 LAB — URINE CULTURE
MICRO NUMBER:: 17362161
SPECIMEN QUALITY:: ADEQUATE

## 2024-03-14 LAB — CULTURE INDICATED

## 2024-04-07 ENCOUNTER — Other Ambulatory Visit: Payer: Self-pay | Admitting: Nurse Practitioner

## 2024-04-07 DIAGNOSIS — N3000 Acute cystitis without hematuria: Secondary | ICD-10-CM

## 2024-04-07 MED ORDER — NITROFURANTOIN MONOHYD MACRO 100 MG PO CAPS: 100.0000 mg | ORAL_CAPSULE | Freq: Two times a day (BID) | ORAL | 0 refills | Status: AC

## 2024-04-15 ENCOUNTER — Other Ambulatory Visit: Payer: Self-pay | Admitting: Obstetrics and Gynecology

## 2024-04-15 ENCOUNTER — Other Ambulatory Visit: Payer: Self-pay | Admitting: Nurse Practitioner

## 2024-04-15 DIAGNOSIS — Z124 Encounter for screening for malignant neoplasm of cervix: Secondary | ICD-10-CM

## 2024-04-15 DIAGNOSIS — Z30432 Encounter for removal of intrauterine contraceptive device: Secondary | ICD-10-CM

## 2024-04-15 DIAGNOSIS — R350 Frequency of micturition: Secondary | ICD-10-CM

## 2024-04-15 DIAGNOSIS — N3000 Acute cystitis without hematuria: Secondary | ICD-10-CM

## 2024-04-15 DIAGNOSIS — N898 Other specified noninflammatory disorders of vagina: Secondary | ICD-10-CM

## 2024-04-15 DIAGNOSIS — Z1331 Encounter for screening for depression: Secondary | ICD-10-CM

## 2024-04-15 DIAGNOSIS — Z01419 Encounter for gynecological examination (general) (routine) without abnormal findings: Secondary | ICD-10-CM

## 2024-04-18 ENCOUNTER — Ambulatory Visit (INDEPENDENT_AMBULATORY_CARE_PROVIDER_SITE_OTHER)

## 2024-04-18 ENCOUNTER — Encounter (INDEPENDENT_AMBULATORY_CARE_PROVIDER_SITE_OTHER): Payer: Self-pay

## 2024-04-18 ENCOUNTER — Telehealth (INDEPENDENT_AMBULATORY_CARE_PROVIDER_SITE_OTHER): Payer: Self-pay

## 2024-04-18 NOTE — Telephone Encounter (Signed)
 Left voicemail to reschedule appointment on 05/02/2024 with Anice, he will be out of office.

## 2024-04-24 ENCOUNTER — Ambulatory Visit (INDEPENDENT_AMBULATORY_CARE_PROVIDER_SITE_OTHER): Admitting: Obstetrics and Gynecology

## 2024-04-24 ENCOUNTER — Ambulatory Visit

## 2024-04-24 ENCOUNTER — Encounter: Payer: Self-pay | Admitting: Obstetrics and Gynecology

## 2024-04-24 VITALS — BP 118/82 | HR 88 | Wt 261.0 lb

## 2024-04-24 DIAGNOSIS — Z30432 Encounter for removal of intrauterine contraceptive device: Secondary | ICD-10-CM

## 2024-04-24 DIAGNOSIS — Z01812 Encounter for preprocedural laboratory examination: Secondary | ICD-10-CM

## 2024-04-24 NOTE — Progress Notes (Signed)
" ° °  Acute Office Visit  Subjective:    Patient ID: Sarah Ramos, female    DOB: 03/18/1966, 59 y.o.   MRN: [id number]   HPI 59 y.o. presents today for Procedure (mirena  iud removal with ultrasound. Pt unable to void. Sarah Ramos is the second attempt to remove IUD, first attempt was unsuccessful. /) . Strings on IUD are gone from prior attempt at removal.  No LMP recorded. (Menstrual status: IUD).    Review of Systems     Objective:    OBGyn Exam  BP 118/82   Pulse 88   Wt 261 lb (118.4 kg)   SpO2 98%   BMI 41.81 kg/m  Wt Readings from Last 3 Encounters:  04/24/24 261 lb (118.4 kg)  03/11/24 261 lb (118.4 kg)  02/08/24 266 lb (120.7 kg)        IUD removal procedure: SVE: IUD long forcep remover was used to enter the cavity and grab the IUD with the US  guidance.  The IUD was removed.   Both arms and body were seen and intact. Patient tolerated the procedure well. .   Assessment & Plan:  IUD removal IUD removed and intact RTC for annual exams and with any concerns.  Discussed she will likely have some bleeding after that should resolve with a day or so.   Sarah Ramos Sarah Ramos "

## 2024-04-29 ENCOUNTER — Ambulatory Visit: Payer: Self-pay | Admitting: Obstetrics and Gynecology

## 2024-05-02 ENCOUNTER — Ambulatory Visit (INDEPENDENT_AMBULATORY_CARE_PROVIDER_SITE_OTHER)

## 2024-05-15 ENCOUNTER — Ambulatory Visit (INDEPENDENT_AMBULATORY_CARE_PROVIDER_SITE_OTHER)

## 2025-03-13 ENCOUNTER — Ambulatory Visit: Admitting: Nurse Practitioner
